# Patient Record
Sex: Male | Born: 1980 | Race: White | Hispanic: No | Marital: Married | Smoking: Never smoker
Health system: Southern US, Community
[De-identification: ages and names within clinical notes are randomized; demographics above are authoritative.]

## PROBLEM LIST (undated history)

## (undated) DIAGNOSIS — G43909 Migraine, unspecified, not intractable, without status migrainosus: Secondary | ICD-10-CM

---

## 2009-08-22 ENCOUNTER — Ambulatory Visit: Payer: Self-pay | Admitting: Family Medicine

## 2009-08-23 LAB — CONVERTED CEMR LAB
AST: 20 units/L (ref 0–37)
Albumin: 4.8 g/dL (ref 3.5–5.2)
BUN: 19 mg/dL (ref 6–23)
Basophils Absolute: 0 10*3/uL (ref 0.0–0.1)
CO2: 30 meq/L (ref 19–32)
Chloride: 101 meq/L (ref 96–112)
Cholesterol: 186 mg/dL (ref 0–200)
Eosinophils Absolute: 0.1 10*3/uL (ref 0.0–0.7)
GFR calc non Af Amer: 104.51 mL/min (ref >60)
Glucose, Bld: 94 mg/dL (ref 70–99)
HCT: 44.1 % (ref 39.0–52.0)
Hemoglobin: 15.1 g/dL (ref 13.0–17.0)
Lymphs Abs: 2.3 10*3/uL (ref 0.7–4.0)
MCHC: 34.3 g/dL (ref 30.0–36.0)
MCV: 91.3 fL (ref 78.0–100.0)
Monocytes Absolute: 0.4 10*3/uL (ref 0.1–1.0)
Neutro Abs: 3.3 10*3/uL (ref 1.4–7.7)
Platelets: 290 10*3/uL (ref 150.0–400.0)
Potassium: 5.1 meq/L (ref 3.5–5.1)
RDW: 12.9 % (ref 11.5–14.6)
Sodium: 138 meq/L (ref 135–145)
TSH: 1.82 microintl units/mL (ref 0.35–5.50)
Total Bilirubin: 1.7 mg/dL — ABNORMAL HIGH (ref 0.3–1.2)
VLDL: 12.4 mg/dL (ref 0.0–40.0)

## 2010-03-05 NOTE — Assessment & Plan Note (Signed)
Summary: new to est---requesting cpx//ccm   Vital Signs:  Patient profile:   30 year old male Height:      71.50 inches Weight:      197 pounds BMI:     27.19 Temp:     98.8 degrees F oral Pulse rate:   80 / minute Pulse rhythm:   regular Resp:     12 per minute BP sitting:   110 / 80  (left arm) Cuff size:   regular  Vitals Entered By: Sid Falcon LPN (August 22, 2009 11:36 AM) CC: New to eatablish   History of Present Illness: New patient to establish care and for complete physical examination.  Past medical history reviewed. No chronic problems. Remote history of concussion 1998 but none since then. Takes no medications. No known drug allergies.  Family history unremarkable.  Social history - he is married. No children. Nonsmoker. Occasional alcohol use. 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 Alcohol use-yes Regular exercise-yes Occupation:  employed Does Patient Exercise:  yes Smoking Status:  never  Review of Systems  The patient denies anorexia, fever, weight loss, weight gain, vision loss, decreased hearing, hoarseness, chest pain, syncope, dyspnea on exertion, peripheral edema, prolonged cough, headaches, hemoptysis, abdominal pain, melena, hematochezia, severe indigestion/heartburn, hematuria, incontinence, genital sores, muscle weakness, suspicious skin lesions,  transient blindness, difficulty walking, depression, unusual weight change, abnormal bleeding, enlarged lymph nodes, and testicular masses.    Physical Exam  General:  Well-developed,well-nourished,in no acute distress; alert,appropriate and cooperative throughout examination Head:  Normocephalic and atraumatic without obvious abnormalities. No apparent alopecia or balding. Eyes:  No corneal or conjunctival inflammation noted. EOMI. Perrla. Funduscopic exam benign, without hemorrhages, exudates or papilledema. Vision grossly normal. Ears:  External ear exam shows no significant lesions or deformities.  Otoscopic examination reveals clear canals, tympanic membranes are intact bilaterally without bulging, retraction, inflammation or discharge. Hearing is grossly normal bilaterally. Mouth:  Oral mucosa and oropharynx without lesions or exudates.  Teeth in good repair. Neck:  No deformities, masses, or tenderness noted. Lungs:  Normal respiratory effort, chest expands symmetrically. Lungs are clear to auscultation, no crackles or wheezes. Heart:  Normal rate and regular rhythm. S1 and S2 normal without gallop, murmur, click, rub or other extra sounds. Abdomen:  Bowel sounds positive,abdomen soft and non-tender without masses, organomegaly or hernias noted. Genitalia:  Testes bilaterally descended without nodularity, tenderness or masses. No scrotal masses or lesions. No penis lesions or urethral discharge. Msk:  No deformity or scoliosis noted of thoracic or lumbar spine.   Extremities:  No clubbing, cyanosis, edema, or deformity noted with normal full range of motion of all joints.   Neurologic:  alert & oriented X3, cranial nerves II-XII intact, and strength normal in all extremities.   Skin:  small hemangioma left anterior chest wall. He has some scattered nevi which appear benign. No concerning skin lesions Cervical Nodes:  No lymphadenopathy noted Psych:  Cognition and judgment appear intact.  Alert and cooperative with normal attention span and  concentration. No apparent delusions, illusions, hallucinations   Impression & Recommendations:  Problem # 1:  Preventive Health Care (ICD-V70.0) obtain screening lab work. Needs tetanus booster by next year. Continue regular exercise.  Other Orders: TLB-Lipid Panel (80061-LIPID) TLB-BMP (Basic Metabolic Panel-BMET) (80048-METABOL) TLB-CBC Platelet - w/Differential (85025-CBCD) TLB-Hepatic/Liver Function Pnl (80076-HEPATIC) TLB-TSH (Thyroid Stimulating Hormone) (84443-TSH) Venipuncture (16109) Specimen Handling (60454)  Patient Instructions: 1)  It is important that you exercise reguarly at least 20 minutes 5 times a week. If you develop chest pain, have severe difficulty breathing, or feel very tired, stop exercising immediately and seek medical attention.  2)  You have an angioma (benign lesion) L chest wall.    Preventive Care Screening  Last Tetanus Booster:    Date:  02/04/2000    Results:  Historical

## 2011-08-21 ENCOUNTER — Ambulatory Visit (INDEPENDENT_AMBULATORY_CARE_PROVIDER_SITE_OTHER): Payer: BC Managed Care – PPO | Admitting: Family Medicine

## 2011-08-21 ENCOUNTER — Encounter: Payer: Self-pay | Admitting: Family Medicine

## 2011-08-21 VITALS — BP 110/82 | Temp 98.3°F | Wt 189.0 lb

## 2011-08-21 DIAGNOSIS — L259 Unspecified contact dermatitis, unspecified cause: Secondary | ICD-10-CM

## 2011-08-21 NOTE — Patient Instructions (Addendum)

## 2011-08-21 NOTE — Progress Notes (Signed)
  Subjective:    Patient ID: Matthew Henry, male    DOB: 1981-01-28, 31 y.o.   MRN: [id number]  HPI  Acute visit. Pruritic rash involving extremities upper and lower. Onset last weekend. [facility] Given oral prednisone taper but only for 6 days. Rash has not improved much. He also tried triamcinolone steroid cream without improvement. Has significant itching. Getting ready to travel out of country. No alleviating factors  Review of Systems  Constitutional: Negative for fever and chills.  Skin: Positive for rash.       Objective:   Physical Exam  Constitutional: He appears well-developed and well-nourished. No distress.  Cardiovascular: Normal rate and regular rhythm.   Skin:       Patient has slightly raised vesicular rash in linear distribution upper extremities and minimal involvement below the left eye          Assessment & Plan:  Contact dermatitis. Depo-Medrol 120 mg IM given. Finish out oral taper

## 2011-10-20 ENCOUNTER — Ambulatory Visit (INDEPENDENT_AMBULATORY_CARE_PROVIDER_SITE_OTHER): Payer: BC Managed Care – PPO | Admitting: Family Medicine

## 2011-10-20 ENCOUNTER — Encounter: Payer: Self-pay | Admitting: Family Medicine

## 2011-10-20 VITALS — BP 120/80 | Temp 98.2°F | Wt 182.0 lb

## 2011-10-20 DIAGNOSIS — L299 Pruritus, unspecified: Secondary | ICD-10-CM

## 2011-10-20 MED ORDER — PREDNISONE 10 MG PO TABS: ORAL_TABLET | ORAL | Status: DC

## 2011-10-20 NOTE — Progress Notes (Signed)
  Subjective:    Patient ID: Matthew Henry, male    DOB: 10/28/1980, 31 y.o.   MRN: [id number]  HPI  Pruritus. Patient had recent contact dermatitis back in July. Possible exposure couple days ago. No rash yet but he is concerned because traveling to [place] this Wednesday. He noticed some itching on his hands and forearms. He eventually responded to prednisone last visit. Itching exacerbated by heat. No nausea or vomiting.   Review of Systems  Constitutional: Negative for fever and chills.  Skin: Negative for rash.       Objective:   Physical Exam  Constitutional: He appears well-developed and well-nourished.  Cardiovascular: Normal rate and regular rhythm.   Pulmonary/Chest: Effort normal and breath sounds normal. No respiratory distress. He has no wheezes. He has no rales.  Skin: No rash noted.          Assessment & Plan:  Pruritus. Patient concerned about possible impending issues with contact dermatitis. We've not recommended medications at this time. We did agree to writing prescription for prednisone taper to take with him in case he develops rash of the next day

## 2012-10-25 ENCOUNTER — Other Ambulatory Visit (INDEPENDENT_AMBULATORY_CARE_PROVIDER_SITE_OTHER): Payer: BC Managed Care – PPO

## 2012-10-25 DIAGNOSIS — Z0000 Encounter for general adult medical examination without abnormal findings: Secondary | ICD-10-CM

## 2012-10-25 LAB — CBC WITH DIFFERENTIAL/PLATELET
Basophils Relative: 0.8 % (ref 0.0–3.0)
Eosinophils Relative: 2.3 % (ref 0.0–5.0)
Hemoglobin: 13.7 g/dL (ref 13.0–17.0)
Lymphocytes Relative: 42.4 % (ref 12.0–46.0)
MCHC: 34.1 g/dL (ref 30.0–36.0)
MCV: 89.6 fl (ref 78.0–100.0)
Neutro Abs: 2.1 10*3/uL (ref 1.4–7.7)
Neutrophils Relative %: 47.1 % (ref 43.0–77.0)
RBC: 4.48 Mil/uL (ref 4.22–5.81)
WBC: 4.5 10*3/uL (ref 4.5–10.5)

## 2012-10-25 LAB — BASIC METABOLIC PANEL
CO2: 26 mEq/L (ref 19–32)
Chloride: 106 mEq/L (ref 96–112)
Creatinine, Ser: 0.9 mg/dL (ref 0.4–1.5)
Sodium: 138 mEq/L (ref 135–145)

## 2012-10-25 LAB — POCT URINALYSIS DIPSTICK
Blood, UA: NEGATIVE
Ketones, UA: NEGATIVE
Protein, UA: NEGATIVE
Spec Grav, UA: 1.015
Urobilinogen, UA: 0.2
pH, UA: 7.5

## 2012-10-25 LAB — HEPATIC FUNCTION PANEL
ALT: 28 U/L (ref 0–53)
Albumin: 4.2 g/dL (ref 3.5–5.2)
Alkaline Phosphatase: 33 U/L — ABNORMAL LOW (ref 39–117)
Bilirubin, Direct: 0.3 mg/dL (ref 0.0–0.3)
Total Protein: 6.7 g/dL (ref 6.0–8.3)

## 2012-10-25 LAB — LIPID PANEL
LDL Cholesterol: 111 mg/dL — ABNORMAL HIGH (ref 0–99)
Total CHOL/HDL Ratio: 3
VLDL: 4.6 mg/dL (ref 0.0–40.0)

## 2012-11-04 ENCOUNTER — Encounter: Payer: BC Managed Care – PPO | Admitting: Family Medicine

## 2012-11-24 ENCOUNTER — Encounter: Payer: BC Managed Care – PPO | Admitting: Family Medicine

## 2012-12-22 ENCOUNTER — Encounter: Payer: Self-pay | Admitting: Family Medicine

## 2012-12-22 ENCOUNTER — Ambulatory Visit (INDEPENDENT_AMBULATORY_CARE_PROVIDER_SITE_OTHER): Payer: BC Managed Care – PPO | Admitting: Family Medicine

## 2012-12-22 VITALS — BP 120/68 | HR 65 | Temp 98.0°F | Ht 71.0 in | Wt 178.0 lb

## 2012-12-22 DIAGNOSIS — Z0000 Encounter for general adult medical examination without abnormal findings: Secondary | ICD-10-CM

## 2012-12-22 NOTE — Progress Notes (Signed)
  Subjective:    Patient ID: Matthew Henry, male    DOB: Jun 24, 1980, 32 y.o.   MRN: [id number]  HPI Patient is seen for complete physical No chronic medical problems. Generally very healthy. He runs for exercise most days of week No history of smoking. He has not had tetanus in over 10 years and he has not sure he wishes to pursue this today. He expresses concern because of some additives that might conflict with his religious beliefs. He also declines flu vaccine.  No past medical history on file. No past surgical history on file.  reports that he has never smoked. He does not have any smokeless tobacco history on file. His alcohol and drug histories are not on file. family history is not on file. No Known Allergies   Review of Systems  Constitutional: Negative for fever, activity change, appetite change and fatigue.  HENT: Negative for congestion, ear pain and trouble swallowing.   Eyes: Negative for pain and visual disturbance.  Respiratory: Negative for cough, shortness of breath and wheezing.   Cardiovascular: Negative for chest pain and palpitations.  Gastrointestinal: Negative for nausea, vomiting, abdominal pain, diarrhea, constipation, blood in stool, abdominal distention and rectal pain.  Genitourinary: Negative for dysuria, hematuria and testicular pain.  Musculoskeletal: Negative for arthralgias and joint swelling.  Skin: Negative for rash.  Neurological: Negative for dizziness, syncope and headaches.  Hematological: Negative for adenopathy.  Psychiatric/Behavioral: Negative for confusion and dysphoric mood.       Objective:   Physical Exam  Constitutional: He is oriented to person, place, and time. He appears well-developed and well-nourished. No distress.  HENT:  Head: Normocephalic and atraumatic.  Right Ear: External ear normal.  Left Ear: External ear normal.  Mouth/Throat: Oropharynx is clear and moist.  Eyes: Conjunctivae and EOM are normal. Pupils are  equal, round, and reactive to light.  Neck: Normal range of motion. Neck supple. No thyromegaly present.  Cardiovascular: Normal rate, regular rhythm and normal heart sounds.   No murmur heard. Pulmonary/Chest: No respiratory distress. He has no wheezes. He has no rales.  Abdominal: Soft. Bowel sounds are normal. He exhibits no distension and no mass. There is no tenderness. There is no rebound and no guarding.  Musculoskeletal: He exhibits no edema.  Lymphadenopathy:    He has no cervical adenopathy.  Neurological: He is alert and oriented to person, place, and time. He displays normal reflexes. No cranial nerve deficit.  Skin: No rash noted.  Psychiatric: He has a normal mood and affect.          Assessment & Plan:  Complete physical. Healthy 32 year old male. No major lab concerns. Tetanus offered but declined by patient. We also offered flu vaccine he declines

## 2012-12-22 NOTE — Progress Notes (Signed)
Pre visit review using our clinic review tool, if applicable. No additional management support is needed unless otherwise documented below in the visit note. 

## 2012-12-22 NOTE — Patient Instructions (Signed)
Let us know if you are interested in getting Tdap.

## 2013-01-17 ENCOUNTER — Ambulatory Visit: Payer: BC Managed Care – PPO

## 2014-11-02 ENCOUNTER — Ambulatory Visit (INDEPENDENT_AMBULATORY_CARE_PROVIDER_SITE_OTHER): Payer: BLUE CROSS/BLUE SHIELD | Admitting: Family Medicine

## 2014-11-02 ENCOUNTER — Encounter: Payer: Self-pay | Admitting: Family Medicine

## 2014-11-02 VITALS — BP 122/70 | HR 99 | Temp 97.6°F | Ht 71.0 in | Wt 178.0 lb

## 2014-11-02 DIAGNOSIS — M546 Pain in thoracic spine: Secondary | ICD-10-CM | POA: Diagnosis not present

## 2014-11-02 LAB — BASIC METABOLIC PANEL
BUN: 16 mg/dL (ref 6–23)
CHLORIDE: 105 meq/L (ref 96–112)
CO2: 32 meq/L (ref 19–32)
CREATININE: 1.14 mg/dL (ref 0.40–1.50)
Calcium: 9.4 mg/dL (ref 8.4–10.5)
GFR: 78.78 mL/min (ref >60.00)
Glucose, Bld: 74 mg/dL (ref 70–99)
POTASSIUM: 4.3 meq/L (ref 3.5–5.1)
SODIUM: 141 meq/L (ref 135–145)

## 2014-11-02 LAB — POCT URINALYSIS DIPSTICK
Bilirubin, UA: NEGATIVE
Blood, UA: NEGATIVE
GLUCOSE UA: NEGATIVE
Ketones, UA: NEGATIVE
LEUKOCYTES UA: NEGATIVE
NITRITE UA: NEGATIVE
PROTEIN UA: NEGATIVE
SPEC GRAV UA: 1.015
UROBILINOGEN UA: 0.2
pH, UA: 7.5

## 2014-11-02 LAB — CBC WITH DIFFERENTIAL/PLATELET
BASOS ABS: 0 10*3/uL (ref 0.0–0.1)
Basophils Relative: 0.6 % (ref 0.0–3.0)
EOS ABS: 0.1 10*3/uL (ref 0.0–0.7)
Eosinophils Relative: 1.9 % (ref 0.0–5.0)
HCT: 43 % (ref 39.0–52.0)
Hemoglobin: 14.4 g/dL (ref 13.0–17.0)
LYMPHS ABS: 1.9 10*3/uL (ref 0.7–4.0)
LYMPHS PCT: 36.9 % (ref 12.0–46.0)
MCHC: 33.4 g/dL (ref 30.0–36.0)
MCV: 89.8 fl (ref 78.0–100.0)
MONOS PCT: 6 % (ref 3.0–12.0)
Monocytes Absolute: 0.3 10*3/uL (ref 0.1–1.0)
NEUTROS ABS: 2.9 10*3/uL (ref 1.4–7.7)
NEUTROS PCT: 54.6 % (ref 43.0–77.0)
PLATELETS: 268 10*3/uL (ref 150.0–400.0)
RBC: 4.79 Mil/uL (ref 4.22–5.81)
RDW: 13.1 % (ref 11.5–15.5)
WBC: 5.2 10*3/uL (ref 4.0–10.5)

## 2014-11-02 LAB — HEPATIC FUNCTION PANEL
ALK PHOS: 35 U/L — AB (ref 39–117)
ALT: 14 U/L (ref 0–53)
AST: 14 U/L (ref 0–37)
Albumin: 4.3 g/dL (ref 3.5–5.2)
BILIRUBIN DIRECT: 0.3 mg/dL (ref 0.0–0.3)
Total Bilirubin: 1.5 mg/dL — ABNORMAL HIGH (ref 0.2–1.2)
Total Protein: 7 g/dL (ref 6.0–8.3)

## 2014-11-02 LAB — SEDIMENTATION RATE: SED RATE: 7 mm/h (ref 0–22)

## 2014-11-02 NOTE — Patient Instructions (Signed)
Follow up for any fever, appetite or weight changes, progressive pain, or any other new symptoms.

## 2014-11-02 NOTE — Progress Notes (Signed)
   Subjective:    Patient ID: Matthew Henry, male    DOB: 05/14/1980, 34 y.o.   MRN: [id number]  HPI  Patient seen with new problem of 2-1/2 month history of mid thoracic back pain. He denies any injury. He does some regular weightlifting and running and has had no difficulty with his dose. His job does require frequent standing. He describes a dull achy pain which tends to be worse at night. He denies any associated symptoms such as abdominal pain, fever, chills, night sweats, cough, dyspnea, or pleuritic pain. He does have a couple young children but denies any injuries with lifting them. Pain radiates helped somewhat bilateral. He has not noticed any actual spine tenderness. Symptoms are not exacerbated by any particular movement other than occasionally extending the back. No chest pains  No past medical history on file. No past surgical history on file.  reports that he has never smoked. He does not have any smokeless tobacco history on file. His alcohol and drug histories are not on file. family history is not on file. No Known Allergies    Review of Systems  Constitutional: Negative for fever, chills, appetite change and unexpected weight change.  Respiratory: Negative for cough and shortness of breath.   Cardiovascular: Negative for chest pain.  Gastrointestinal: Negative for abdominal pain.  Genitourinary: Negative for dysuria and hematuria.  Musculoskeletal: Positive for back pain.  Skin: Negative for rash.  Hematological: Negative for adenopathy.       Objective:   Physical Exam  Constitutional: He appears well-developed and well-nourished.  Cardiovascular: Normal rate and regular rhythm.   Pulmonary/Chest: Effort normal and breath sounds normal. No respiratory distress. He has no wheezes. He has no rales.  Abdominal: Soft. Bowel sounds are normal. He exhibits no distension and no mass. There is no tenderness. There is no rebound and no guarding.  Musculoskeletal:  Patient  has no reproducible spine tenderness. Full range of motion cervical spine. He has some mild muscular tenderness bilateral thoracic area about midway down each side  Skin: No rash noted.          Assessment & Plan:  New problem of thoracic back pain which is bilateral. He does not have any red flags such as appetite or weight changes, fever, etc. Suspect muscular. Given duration, will obtain further labs with CBC, chemistries, sedimentation rate, urinalysis. We discussed possible plain films. We also discussed physical therapy but he will be out of town for the next few weeks.   in the meantime, consider over-the-counter Advil or Aleve as needed. Consider muscle massage and heat for symptom relief

## 2014-11-02 NOTE — Progress Notes (Signed)
Pre visit review using our clinic review tool, if applicable. No additional management support is needed unless otherwise documented below in the visit note. 

## 2015-06-05 ENCOUNTER — Encounter: Payer: Self-pay | Admitting: Adult Health

## 2015-06-05 ENCOUNTER — Ambulatory Visit (INDEPENDENT_AMBULATORY_CARE_PROVIDER_SITE_OTHER): Payer: BLUE CROSS/BLUE SHIELD | Admitting: Adult Health

## 2015-06-05 VITALS — BP 108/70 | Temp 98.1°F | Ht 71.0 in | Wt 178.2 lb

## 2015-06-05 DIAGNOSIS — H6593 Unspecified nonsuppurative otitis media, bilateral: Secondary | ICD-10-CM | POA: Diagnosis not present

## 2015-06-05 DIAGNOSIS — J0140 Acute pansinusitis, unspecified: Secondary | ICD-10-CM

## 2015-06-05 NOTE — Patient Instructions (Addendum)
Start taking the antibiotics as prescribed  You can use Afrin for 2 days and Flonase every day. This will help get rid of the fluid behind the ears.   Follow up with myself or Dr. Jacinto Reap on Friday if there is no improvement.     Otitis Media With Effusion Otitis media with effusion is the presence of fluid in the middle ear. This is a common problem in children, which often follows ear infections. It may be present for weeks or longer after the infection. Unlike an acute ear infection, otitis media with effusion refers only to fluid behind the ear drum and not infection. Children with repeated ear and sinus infections and allergy problems are the most likely to get otitis media with effusion. CAUSES  The most frequent cause of the fluid buildup is dysfunction of the eustachian tubes. These are the tubes that drain fluid in the ears to the back of the nose (nasopharynx). SYMPTOMS   The main symptom of this condition is hearing loss. As a result, you or your child may:  Listen to the TV at a loud volume.  Not respond to questions.  Ask "what" often when spoken to.  Mistake or confuse one sound or word for another.  There may be a sensation of fullness or pressure but usually not pain. DIAGNOSIS   Your health care provider will diagnose this condition by examining you or your child's ears.  Your health care provider may test the pressure in you or your child's ear with a tympanometer.  A hearing test may be conducted if the problem persists. TREATMENT   Treatment depends on the duration and the effects of the effusion.  Antibiotics, decongestants, nose drops, and cortisone-type drugs (tablets or nasal spray) may not be helpful.  Children with persistent ear effusions may have delayed language or behavioral problems. Children at risk for developmental delays in hearing, learning, and speech may require referral to a specialist earlier than children not at risk.  You or your child's  health care provider may suggest a referral to an ear, nose, and throat surgeon for treatment. The following may help restore normal hearing:  Drainage of fluid.  Placement of ear tubes (tympanostomy tubes).  Removal of adenoids (adenoidectomy). HOME CARE INSTRUCTIONS   Avoid secondhand smoke.  Infants who are breastfed are less likely to have this condition.  Avoid feeding infants while they are lying flat.  Avoid known environmental allergens.  Avoid people who are sick. SEEK MEDICAL CARE IF:   Hearing is not better in 3 months.  Hearing is worse.  Ear pain.  Drainage from the ear.  Dizziness. MAKE SURE YOU:   Understand these instructions.  Will watch your condition.  Will get help right away if you are not doing well or get worse.   This information is not intended to replace advice given to you by your health care provider. Make sure you discuss any questions you have with your health care provider.   Document Released: 02/28/2004 Document Revised: 02/10/2014 Document Reviewed: 08/17/2012 Elsevier Interactive Patient Education [employer].

## 2015-06-05 NOTE — Progress Notes (Signed)
   Subjective:    Patient ID: Matthew Henry, male    DOB: [birth year], 35 y.o.   MRN: [id number]  HPI  35 year old male who presents to the office today for sinus pain and pressure, the feeling of ear fullness ( R>L). He is starting to have migraines and he endorses slight photophobia.   He was seen at Urgent Care three days ago and he was prescribed Augmentin and Sudafed 12 hour. He reports that it does not seem to be working. He started taking his first dose mid day yesterday due to having a stomach virus the day prior. He has not taken any antibiotics today.   He denies any discharge from the ears.    Review of Systems  HENT: Positive for ear pain. Negative for ear discharge.   All other systems reviewed and are negative.  No past medical history on file.  Social History   Social History  . Marital Status: Married    Spouse Name: N/A  . Number of Children: N/A  . Years of Education: N/A   Occupational History  . Not on file.   Social History Main Topics  . Smoking status: Never Smoker   . Smokeless tobacco: Not on file  . Alcohol Use: Not on file  . Drug Use: Not on file  . Sexual Activity: Not on file   Other Topics Concern  . Not on file   Social History Narrative    No past surgical history on file.  No family history on file.  No Known Allergies  No current outpatient prescriptions on file prior to visit.   No current facility-administered medications on file prior to visit.    BP 108/70 mmHg  Temp(Src) 98.1 F (36.7 C) (Oral)  Ht 5\' 11"  (1.803 m)  Wt 178 lb 3.2 oz (80.831 kg)  BMI 24.86 kg/m2       Objective:   Physical Exam  Constitutional: He appears well-developed and well-nourished. No distress.  HENT:  Head: Normocephalic and atraumatic.  Right Ear: External ear and ear canal normal. Tympanic membrane is bulging. Tympanic membrane is not erythematous.  Left Ear: Hearing, external ear and ear canal normal. Tympanic membrane is bulging.  Tympanic membrane is not retracted.  Nose: Nose normal. No mucosal edema or rhinorrhea. Right sinus exhibits no maxillary sinus tenderness and no frontal sinus tenderness. Left sinus exhibits no maxillary sinus tenderness and no frontal sinus tenderness.  Mouth/Throat: Uvula is midline and oropharynx is clear and moist. No oropharyngeal exudate, posterior oropharyngeal edema, posterior oropharyngeal erythema or tonsillar abscesses.  Skin: He is not diaphoretic.  Nursing note and vitals reviewed.     Assessment & Plan:  1. Otitis media with effusion, bilateral -  Take antibiotics as directed.  - Add Flonase - Can use Afrin for 2-3 days - Sudafed - Follow up if no improvement in the next 2-3 days 2. Acute pansinusitis, recurrence not specified - Continue with Augmentin  - Flonase - Afrin

## 2015-06-15 ENCOUNTER — Encounter: Payer: Self-pay | Admitting: Family Medicine

## 2015-06-15 ENCOUNTER — Ambulatory Visit (INDEPENDENT_AMBULATORY_CARE_PROVIDER_SITE_OTHER): Payer: BLUE CROSS/BLUE SHIELD | Admitting: Family Medicine

## 2015-06-15 VITALS — BP 90/70 | HR 91 | Temp 98.6°F | Ht 71.0 in | Wt 180.6 lb

## 2015-06-15 DIAGNOSIS — H938X3 Other specified disorders of ear, bilateral: Secondary | ICD-10-CM | POA: Diagnosis not present

## 2015-06-15 DIAGNOSIS — R5383 Other fatigue: Secondary | ICD-10-CM | POA: Diagnosis not present

## 2015-06-15 DIAGNOSIS — G44209 Tension-type headache, unspecified, not intractable: Secondary | ICD-10-CM

## 2015-06-15 MED ORDER — PREDNISONE 10 MG PO TABS: ORAL_TABLET | ORAL | Status: DC

## 2015-06-15 MED ORDER — AMITRIPTYLINE HCL 10 MG PO TABS: 10.0000 mg | ORAL_TABLET | Freq: Every day | ORAL | Status: DC

## 2015-06-15 NOTE — Progress Notes (Signed)
Pre visit review using our clinic review tool, if applicable. No additional management support is needed unless otherwise documented below in the visit note. 

## 2015-06-15 NOTE — Patient Instructions (Signed)
Follow up for any fever or worsening headache.

## 2015-06-15 NOTE — Progress Notes (Signed)
   Subjective:    Patient ID: Matthew Henry, male    DOB: 01-Oct-1980, 35 y.o.   MRN: [id number]  HPI Patient seen for follow up regarding headaches and persistent bilateral ear congestion. He developed some bilateral ear pain and went to urgent care a few weeks ago. Was prescribed Augmentin. Was also taking some Sudafed. Was seen back here in follow-up with not much improvement in symptoms. It was noted on visit here that he had "bulging eardrums "bilaterally. Patient finished out Augmentin and also added Flonase for nasal congestion.  At this point, he has some clear nasal mucus from the right nostril but not the left. No nosebleed. He is describing headaches which are bifrontal and also bilateral occipital with some associated muscle soreness and neck soreness. Not sleeping well. No history of chronic headache. Nonspecific malaise. Headaches have become daily. They're very symmetric. No photophobia. Mild to moderate severity No exertional headache. No vomiting. Sometimes feel slightly lightheaded. No vertigo. Still has bilateral ear congestion symptoms but no hearing changes.  No past medical history on file. No past surgical history on file.  reports that he has never smoked. He does not have any smokeless tobacco history on file. His alcohol and drug histories are not on file. family history is not on file. No Known Allergies     Review of Systems  Constitutional: Positive for fatigue. Negative for chills.  HENT: Positive for congestion. Negative for ear discharge, ear pain and hearing loss.   Respiratory: Negative for cough and shortness of breath.   Cardiovascular: Negative for chest pain.  Neurological: Positive for light-headedness and headaches. Negative for tremors, seizures, syncope and weakness.       Objective:   Physical Exam  Constitutional: He is oriented to person, place, and time. He appears well-developed and well-nourished.  HENT:  Right Ear: External ear  normal.  Left Ear: External ear normal.  Neck: Neck supple. No thyromegaly present.  Cardiovascular: Normal rate and regular rhythm.   Pulmonary/Chest: Effort normal and breath sounds normal. No respiratory distress. He has no wheezes. He has no rales.  Musculoskeletal: He exhibits no edema.  Lymphadenopathy:    He has no cervical adenopathy.  Neurological: He is alert and oriented to person, place, and time. He has normal reflexes. No cranial nerve deficit. Coordination normal.          Assessment & Plan:  Patient presents with multiple symptoms including fatigue, daily bilateral headache, persistent bilateral ear congestion. Exam today's does not show any evidence for persistent chronic otitis media. No obvious evidence for effusion. He has some persistent sinus congestion and may have some eustachian tube dysfunction. 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 We recommend the following:  -Short-term trial of amitriptyline 10 mg daily at bedtime in attempt to break his headache pattern. Avoid daily use of analgesics. -Short-term use of prednisone for his persistent congestive symptom -No indication for further antibiotics at this time -Reassess in 2 weeks and sooner as needed for any worsening headache or new symptoms  Eulas Post MD [facility]

## 2015-06-23 ENCOUNTER — Inpatient Hospital Stay (HOSPITAL_COMMUNITY)
Admission: EM | Admit: 2015-06-23 | Discharge: 2015-06-23 | DRG: 055 | Disposition: A | Discharge status: Other Acute Inpatient Hospital | Payer: BLUE CROSS/BLUE SHIELD | Attending: Neurosurgery | Admitting: Neurosurgery

## 2015-06-23 ENCOUNTER — Encounter (HOSPITAL_COMMUNITY): Payer: Self-pay | Admitting: Oncology

## 2015-06-23 ENCOUNTER — Inpatient Hospital Stay (HOSPITAL_COMMUNITY): Payer: BLUE CROSS/BLUE SHIELD

## 2015-06-23 ENCOUNTER — Emergency Department (HOSPITAL_COMMUNITY): Payer: BLUE CROSS/BLUE SHIELD

## 2015-06-23 DIAGNOSIS — W1830XA Fall on same level, unspecified, initial encounter: Secondary | ICD-10-CM | POA: Diagnosis not present

## 2015-06-23 DIAGNOSIS — Z79899 Other long term (current) drug therapy: Secondary | ICD-10-CM

## 2015-06-23 DIAGNOSIS — D496 Neoplasm of unspecified behavior of brain: Secondary | ICD-10-CM | POA: Diagnosis not present

## 2015-06-23 DIAGNOSIS — G9389 Other specified disorders of brain: Secondary | ICD-10-CM | POA: Diagnosis present

## 2015-06-23 DIAGNOSIS — G939 Disorder of brain, unspecified: Secondary | ICD-10-CM | POA: Diagnosis present

## 2015-06-23 HISTORY — DX: Migraine, unspecified, not intractable, without status migrainosus: G43.909

## 2015-06-23 LAB — MRSA PCR SCREENING: MRSA BY PCR: NEGATIVE

## 2015-06-23 LAB — CBC WITH DIFFERENTIAL/PLATELET
BASOS ABS: 0 10*3/uL (ref 0.0–0.1)
Basophils Relative: 0 %
EOS ABS: 0 10*3/uL (ref 0.0–0.7)
Eosinophils Relative: 0 %
HCT: 41.3 % (ref 39.0–52.0)
HEMOGLOBIN: 14 g/dL (ref 13.0–17.0)
LYMPHS ABS: 0.9 10*3/uL (ref 0.7–4.0)
LYMPHS PCT: 11 %
MCH: 30 pg (ref 26.0–34.0)
MCHC: 33.9 g/dL (ref 30.0–36.0)
MCV: 88.4 fL (ref 78.0–100.0)
Monocytes Absolute: 0.4 10*3/uL (ref 0.1–1.0)
Monocytes Relative: 5 %
NEUTROS PCT: 84 %
Neutro Abs: 6.8 10*3/uL (ref 1.7–7.7)
PLATELETS: 242 10*3/uL (ref 150–400)
RBC: 4.67 MIL/uL (ref 4.22–5.81)
RDW: 12.8 % (ref 11.5–15.5)
WBC: 8.1 10*3/uL (ref 4.0–10.5)

## 2015-06-23 LAB — BASIC METABOLIC PANEL
ANION GAP: 5 (ref 5–15)
BUN: 14 mg/dL (ref 6–20)
CALCIUM: 9 mg/dL (ref 8.9–10.3)
CO2: 27 mmol/L (ref 22–32)
Chloride: 104 mmol/L (ref 101–111)
Creatinine, Ser: 0.93 mg/dL (ref 0.61–1.24)
GLUCOSE: 152 mg/dL — AB (ref 65–99)
Potassium: 4.5 mmol/L (ref 3.5–5.1)
SODIUM: 136 mmol/L (ref 135–145)

## 2015-06-23 MED ORDER — NALOXONE HCL 0.4 MG/ML IJ SOLN
INTRAMUSCULAR | Status: AC
  Administered 2015-06-23: 0.2 mg via INTRAVENOUS
  Filled 2015-06-23: qty 1

## 2015-06-23 MED ORDER — MORPHINE SULFATE (PF) 2 MG/ML IV SOLN
1.0000 mg | INTRAVENOUS | Status: DC | PRN
  Administered 2015-06-23: 2 mg via INTRAVENOUS
  Filled 2015-06-23: qty 1

## 2015-06-23 MED ORDER — DEXAMETHASONE SODIUM PHOSPHATE 4 MG/ML IJ SOLN
8.0000 mg | INTRAMUSCULAR | Status: DC
  Administered 2015-06-23: 8 mg via INTRAVENOUS
  Filled 2015-06-23: qty 2

## 2015-06-23 MED ORDER — MORPHINE SULFATE (PF) 2 MG/ML IV SOLN
1.0000 mg | INTRAVENOUS | Status: DC | PRN
  Administered 2015-06-23: 1 mg via INTRAVENOUS
  Filled 2015-06-23: qty 1

## 2015-06-23 MED ORDER — GADOBENATE DIMEGLUMINE 529 MG/ML IV SOLN
15.0000 mL | Freq: Once | INTRAVENOUS | Status: AC | PRN
  Administered 2015-06-23: 15 mL via INTRAVENOUS

## 2015-06-23 MED ORDER — VALPROATE SODIUM 500 MG/5ML IV SOLN
1000.0000 mg | Freq: Once | INTRAVENOUS | Status: AC
  Administered 2015-06-23: 1000 mg via INTRAVENOUS
  Filled 2015-06-23: qty 10

## 2015-06-23 MED ORDER — PROCHLORPERAZINE EDISYLATE 5 MG/ML IJ SOLN
10.0000 mg | Freq: Once | INTRAMUSCULAR | Status: AC
  Administered 2015-06-23: 10 mg via INTRAVENOUS
  Filled 2015-06-23: qty 2

## 2015-06-23 MED ORDER — NALOXONE HCL 0.4 MG/ML IJ SOLN
0.4000 mg | Freq: Once | INTRAMUSCULAR | Status: AC
  Administered 2015-06-23: 0.2 mg via INTRAVENOUS

## 2015-06-23 MED ORDER — SODIUM CHLORIDE 0.9 % IV SOLN
INTRAVENOUS | Status: DC
  Administered 2015-06-23: 75 mL via INTRAVENOUS

## 2015-06-23 MED ORDER — PANTOPRAZOLE SODIUM 40 MG IV SOLR
40.0000 mg | INTRAVENOUS | Status: DC
  Filled 2015-06-23: qty 40

## 2015-06-23 MED ORDER — DEXAMETHASONE SODIUM PHOSPHATE 10 MG/ML IJ SOLN
10.0000 mg | Freq: Once | INTRAMUSCULAR | Status: AC
  Administered 2015-06-23: 10 mg via INTRAVENOUS
  Filled 2015-06-23: qty 1

## 2015-06-23 MED ORDER — SODIUM CHLORIDE 0.9 % IV BOLUS (SEPSIS)
1000.0000 mL | Freq: Once | INTRAVENOUS | Status: AC
  Administered 2015-06-23: 1000 mL via INTRAVENOUS

## 2015-06-23 MED ORDER — DIPHENHYDRAMINE HCL 50 MG/ML IJ SOLN
25.0000 mg | Freq: Once | INTRAMUSCULAR | Status: AC
  Administered 2015-06-23: 25 mg via INTRAVENOUS
  Filled 2015-06-23: qty 1

## 2015-06-23 NOTE — Discharge Summary (Signed)
Physician Discharge Summary  Patient ID: Matthew Henry MRN: [id number] DOB/AGE: 1980-07-22 35 y.o.  Admit date: 06/23/2015 Discharge date: 06/23/2015  Admission Diagnoses:brain tumor  Discharge Diagnoses:  Active Problems:   Brain mass   Discharged Condition: stable  Hospital Course: observation  Consults none  Significant Diagnostic Studies: mri  Treatments: decadron, protonix,depakote  Discharge Exam: Blood pressure 129/59, pulse 61, temperature 98.5 F (36.9 C), temperature source Oral, resp. rate 18, height 6' (1.829 m), weight 81.647 kg (180 lb), SpO2 97 %. Except for photophobia he is neuro he is intact  Disposition:wife wants him to be transferred to [facility] . Dr Maricela Bo agrees to accept him in transfer     Medication List    ASK your doctor about these medications        amitriptyline 10 MG tablet  Commonly known as:  ELAVIL  Take 1 tablet (10 mg total) by mouth at bedtime.     fluticasone 50 MCG/ACT nasal spray  Commonly known as:  FLONASE  Place 1 spray into both nostrils daily.     multivitamin with minerals Tabs tablet  Take 1 tablet by mouth daily.     naproxen sodium 220 MG tablet  Commonly known as:  ANAPROX  Take 220 mg by mouth 2 (two) times daily as needed (pain).         Signed: Floyce Stakes 06/23/2015, 3:11 PM

## 2015-06-23 NOTE — ED Notes (Signed)
Patient's wife at bedside.

## 2015-06-23 NOTE — ED Notes (Signed)
Cyndee Brightly, pt's wife would like to be called w/ an update.  Her # is [phone].

## 2015-06-23 NOTE — ED Notes (Signed)
Pt transported to CT ?

## 2015-06-23 NOTE — H&P (Signed)
Matthew Henry, ORZEL NO.:  [id number]  MEDICAL RECORD NO.:  [id number]  LOCATION:  3M02C                        FACILITY:  [facility]  PHYSICIAN:  Leeroy Cha, M.D.   DATE OF BIRTH:  [birth year]  DATE OF ADMISSION:  06/23/2015 DATE OF DISCHARGE:                             HISTORY & PHYSICAL   The patient is a gentleman of 35 years old who had been complaining of headache for at least 6 weeks.  He had been treated for sinusitis with antibiotics for ear infection as well as subtle infection. Nevertheless, despite treatment, the headaches getting worse.  This morning, he developed nausea, vomiting, severe headache.  He was quite sensitive to light and sound and he fainted.  He hit his head.  He was brought to the Emergency Room [facility] where a CT scan of the head was done.  We were called, and we decided to transfer the patient immediately to the intensive care unit.  PAST SURGICAL HISTORY:  Negative.  REVIEW OF SYSTEMS:  Positive for headache, nausea, and vomiting.  SOCIAL HISTORY:  He drinks socially.  He does not smoke.  MEDICATIONS:  He is taking Elavil, Flonase, and vitamins.  PHYSICAL EXAMINATION:  GENERAL:  The patient is resting.  He has his head over his eyes to prevent the light sensitivity. VITAL SIGNS:  The blood pressure is 130/70 with a pulse of 72. HEAD, NOSE, AND THROAT:  Normal. NECK:  Normal. LUNGS:  Clear. HEART:  Sounds normal. ABDOMEN:  Normal. EXTREMITIES:  Normal. NEUROLOGICAL:  Completely normal.  He has no weakness.  Sensory is normal.  The cranial nerves are normal.  The CT scan of the head is quite impressive.  This gentleman has severe edema in the right parietal area going to the right frontal and left occipital area with shift from right to left of approximately 10 mm.  It appears that he has had tumor about 8 x 6 cm in the right parietal area.  CLINICAL IMPRESSION:  Mass in the right parietal area with  edema.  RECOMMENDATION:  I talked to him and his wife.  We started him already on Depakote as well as intravenous Decadron.  We are going to obtain an MRI of the brain with and without contrast.  The situation here, the most likely we are dealing with a brain tumor producing quite a bit of edema.  The goal is to treat the edema, most likely taking to surgery early next week  Nevertheless, I am going to give the option to his wife and the patient himself to see if we want to go to one of the medical center, such as [facility] or [facility].  The situation is quite worrisome because this gentleman is absolutely quite normal and quite healthy.          ______________________________ Leeroy Cha, M.D.     EB/MEDQ  D:  06/23/2015  T:  06/23/2015  Job:  [id number]

## 2015-06-23 NOTE — ED Provider Notes (Signed)
CSN: [id number]     Arrival date & time 06/23/15  0515 History   First MD Initiated Contact with Patient 06/23/15 0542     Chief Complaint  Patient presents with  . Headache     (Consider location/radiation/quality/duration/timing/severity/associated sxs/prior Treatment) HPI Comments: Patient presents to the emergency department for evaluation of headache. Patient reports that he has been experiencing intermittent headaches for the last 6 weeks. When it started he had your complaints and sinus congestion, was treated with antibiotics by his primary doctor for ear infection and sinus infection. He reports that his sinus and ear symptoms have not improved and now his headaches are getting worse. Tonight he woke up and had a severe headache associated with nausea vomiting and sensitivity to light and sound. He reports that he vomited once tonight and was streaks of blood and then he passed out. He hit his head on the wall when he passed out.  Patient is a 35 y.o. male presenting with headaches.  Headache   Past Medical History  Diagnosis Date  . Migraines    History reviewed. No pertinent past surgical history. History reviewed. No pertinent family history. Social History  Substance Use Topics  . Smoking status: Never Smoker   . Smokeless tobacco: Never Used  . Alcohol Use: Yes     Comment: rare    Review of Systems  Neurological: Positive for headaches.  All other systems reviewed and are negative.     Allergies  Review of patient's allergies indicates no known allergies.  Home Medications   Prior to Admission medications   Medication Sig Start Date End Date Taking? Authorizing Provider  amitriptyline (ELAVIL) 10 MG tablet Take 1 tablet (10 mg total) by mouth at bedtime. 06/15/15  Yes Eulas Post, MD  fluticasone (FLONASE) 50 MCG/ACT nasal spray Place 1 spray into both nostrils daily.   Yes Historical Provider, MD  Multiple Vitamin (MULTIVITAMIN WITH MINERALS) TABS  tablet Take 1 tablet by mouth daily.   Yes Historical Provider, MD  naproxen sodium (ANAPROX) 220 MG tablet Take 220 mg by mouth 2 (two) times daily as needed (pain).   Yes Historical Provider, MD   BP 123/64 mmHg  Pulse 60  Temp(Src) 98.1 F (36.7 C) (Oral)  Resp 18  Ht 6' (1.829 m)  Wt 180 lb (81.647 kg)  BMI 24.41 kg/m2  SpO2 99% Physical Exam  Constitutional: He is oriented to person, place, and time. He appears well-developed and well-nourished. No distress.  HENT:  Head: Normocephalic and atraumatic.  Right Ear: Hearing normal.  Left Ear: Hearing normal.  Nose: Nose normal.  Mouth/Throat: Oropharynx is clear and moist and mucous membranes are normal.  Eyes: Conjunctivae and EOM are normal. Pupils are equal, round, and reactive to light.  Neck: Normal range of motion. Neck supple.  Cardiovascular: Regular rhythm, S1 normal and S2 normal.  Exam reveals no gallop and no friction rub.   No murmur heard. Pulmonary/Chest: Effort normal and breath sounds normal. No respiratory distress. He exhibits no tenderness.  Abdominal: Soft. Normal appearance and bowel sounds are normal. There is no hepatosplenomegaly. There is no tenderness. There is no rebound, no guarding, no tenderness at McBurney's point and negative Murphy's sign. No hernia.  Musculoskeletal: Normal range of motion.  Neurological: He is alert and oriented to person, place, and time. He has normal strength. No cranial nerve deficit or sensory deficit. Coordination normal. GCS eye subscore is 4. GCS verbal subscore is 5. GCS motor subscore is 6.  Normal grip strength bilaterally Negative pronator drift Sensation bilateral upper extremities Normal lower extremity strength against gravity No ataxia  Skin: Skin is warm, dry and intact. No rash noted. No cyanosis.  Psychiatric: He has a normal mood and affect. His speech is normal and behavior is normal. Thought content normal.  Nursing note and vitals reviewed.   ED Course   Procedures (including critical care time) Labs Review Labs Reviewed  BASIC METABOLIC PANEL - Abnormal; Notable for the following:    Glucose, Bld 152 (*)    All other components within normal limits  CBC WITH DIFFERENTIAL/PLATELET    Imaging Review No results found. I have personally reviewed and evaluated these images and lab results as part of my medical decision-making.   EKG Interpretation None      MDM   Final diagnoses:  Brain mass    Patient presented to the ER for evaluation of headache. Patient has been experiencing progressively worsening headache over period of approximately 6 weeks. Patient had a syncopal episode tonight, falling and hitting his head. CT scan was performed to further evaluate. Results show a large right-sided mass with edema and midline shift. This raises concern for possible seizure causing the syncopal episode.   Case discussed with Dr. Joya Salm, on call for neurosurgery. He recommends administering Decadron and Depakote, transfer to the ICU at [facility]. I did discuss the findings with the patient. I offered to call his wife, but he did not want me to make the call, says that he will call her himself.    Orpah Greek, MD 06/23/15 [phone]

## 2015-06-23 NOTE — Progress Notes (Signed)
Patient ID: Matthew Henry, male   DOB: 1980/06/21, 35 y.o.   MRN: [id number] Wife wants him to be transferred  To [facility]. Will call the neurosurgical resident on call

## 2015-06-23 NOTE — ED Notes (Signed)
Pt has had intermittent migraines x 6 weeks.  Tonight pt woke up w/ headache, +nausea, +sensitivity to light and sound, emesis w/ streaks of blood.  Rates pain 10/10, stabbing in nature.  States pain is localized to the base of skull and behind left eye.  +blurred vision, difficulty focusing.  While vomiting tonight pt fell and struck his head on the bathroom wall, denies LOC.

## 2015-06-23 NOTE — Progress Notes (Signed)
Staff was called into the room, where wife expressed concerns of new onset expressive aphasia. Dr. Joya Salm informed of change and orders received for Narcan 0.2 mg IVP times one and may repeat after 3 minutes if no improvement. After orders received, assessed the patient and is neurologically intact, no evidence of expressive aphasia, and answering questions approprately. Narcan 0.2 mg ivp given, but not repeated. Patient's neurological assessment did not change from author's prior assessments. Will continue to monitor and await [facility] for transport to [facility].

## 2015-06-23 NOTE — Progress Notes (Signed)
Patient ID: Matthew Henry, male   DOB: June 01, 1980, 35 y.o.   MRN: [id number] Spoke with his wife and showed the mri as well the radiological report. She is fully aware that the tumor is most likely a malignant tumor which has been growing for a while. Also talked about the edema and shift. Patient is neurologically intact. My goal is to treat his edema with decadron with the hope that the patient can give [place] his input and be able to see his kids before a surgical intervention. She is also aware the if he declines i will take him to surgery anytime to do a gross resection. Also i gave her the choice to [facility]. i will continue to be on call till tomorrow Sunday at 3 pm.

## 2015-06-23 NOTE — ED Notes (Signed)
Report given to Wyn Quaker RN

## 2015-09-18 ENCOUNTER — Other Ambulatory Visit: Payer: Self-pay

## 2015-09-18 ENCOUNTER — Other Ambulatory Visit (INDEPENDENT_AMBULATORY_CARE_PROVIDER_SITE_OTHER): Payer: BLUE CROSS/BLUE SHIELD

## 2015-09-18 DIAGNOSIS — C719 Malignant neoplasm of brain, unspecified: Secondary | ICD-10-CM

## 2015-09-19 LAB — CBC WITH DIFFERENTIAL
Basophils Absolute: 0 10*3/uL (ref 0.0–0.2)
Basos: 1 %
EOS (ABSOLUTE): 0.1 10*3/uL (ref 0.0–0.4)
EOS: 4 %
HEMATOCRIT: 38 % (ref 37.5–51.0)
Hemoglobin: 12.5 g/dL — ABNORMAL LOW (ref 12.6–17.7)
IMMATURE GRANULOCYTES: 0 %
Immature Grans (Abs): 0 10*3/uL (ref 0.0–0.1)
LYMPHS ABS: 0.4 10*3/uL — AB (ref 0.7–3.1)
Lymphs: 24 %
MCH: 29.2 pg (ref 26.6–33.0)
MCHC: 32.9 g/dL (ref 31.5–35.7)
MCV: 89 fL (ref 79–97)
MONOS ABS: 0.1 10*3/uL (ref 0.1–0.9)
Monocytes: 9 %
NEUTROS PCT: 62 %
Neutrophils Absolute: 1 10*3/uL — ABNORMAL LOW (ref 1.4–7.0)
RBC: 4.28 x10E6/uL (ref 4.14–5.80)
RDW: 16.1 % — AB (ref 12.3–15.4)
WBC: 1.6 10*3/uL — AB (ref 3.4–10.8)

## 2015-12-21 DIAGNOSIS — C712 Malignant neoplasm of temporal lobe: Secondary | ICD-10-CM | POA: Diagnosis not present

## 2016-01-02 ENCOUNTER — Other Ambulatory Visit (INDEPENDENT_AMBULATORY_CARE_PROVIDER_SITE_OTHER): Payer: BLUE CROSS/BLUE SHIELD

## 2016-01-02 ENCOUNTER — Telehealth: Payer: Self-pay | Admitting: Family Medicine

## 2016-01-02 DIAGNOSIS — D6959 Other secondary thrombocytopenia: Secondary | ICD-10-CM | POA: Diagnosis not present

## 2016-01-02 DIAGNOSIS — T50905A Adverse effect of unspecified drugs, medicaments and biological substances, initial encounter: Secondary | ICD-10-CM | POA: Diagnosis not present

## 2016-01-02 NOTE — Telephone Encounter (Signed)
Pt has been scheduled.  °

## 2016-01-02 NOTE — Telephone Encounter (Signed)
° ° °  Pt has an order from [facility] to have labs drawn and is asking if it will be ok for him to come here and have this done l

## 2016-01-02 NOTE — Telephone Encounter (Signed)
YES--please schedule.

## 2016-01-03 LAB — CBC WITH DIFFERENTIAL/PLATELET
BASOS PCT: 0 %
Basophils Absolute: 0 cells/uL (ref 0–200)
Eosinophils Absolute: 39 cells/uL (ref 15–500)
Eosinophils Relative: 3 %
HEMATOCRIT: 30.1 % — AB (ref 38.5–50.0)
HEMOGLOBIN: 10.3 g/dL — AB (ref 13.2–17.1)
LYMPHS ABS: 611 {cells}/uL — AB (ref 850–3900)
Lymphocytes Relative: 47 %
MCH: 32.6 pg (ref 27.0–33.0)
MCHC: 34.2 g/dL (ref 32.0–36.0)
MCV: 95.3 fL (ref 80.0–100.0)
MONO ABS: 130 {cells}/uL — AB (ref 200–950)
MPV: 10.6 fL (ref 7.5–12.5)
Monocytes Relative: 10 %
NEUTROS PCT: 40 %
Neutro Abs: 520 cells/uL — ABNORMAL LOW (ref 1500–7800)
Platelets: 62 10*3/uL — ABNORMAL LOW (ref 140–400)
RBC: 3.16 MIL/uL — AB (ref 4.20–5.80)
RDW: 14.4 % (ref 11.0–15.0)
WBC: 1.3 10*3/uL — AB (ref 3.8–10.8)

## 2016-01-03 LAB — PATHOLOGIST SMEAR REVIEW

## 2016-01-04 NOTE — Telephone Encounter (Signed)
Lab results sent to [facility].

## 2016-01-04 NOTE — Telephone Encounter (Signed)
Pt would like to have lab results and also sent to [facility].

## 2016-01-10 DIAGNOSIS — C719 Malignant neoplasm of brain, unspecified: Secondary | ICD-10-CM | POA: Diagnosis not present

## 2016-01-10 DIAGNOSIS — C713 Malignant neoplasm of parietal lobe: Secondary | ICD-10-CM | POA: Diagnosis not present

## 2016-01-21 DIAGNOSIS — C712 Malignant neoplasm of temporal lobe: Secondary | ICD-10-CM | POA: Diagnosis not present

## 2016-02-01 DIAGNOSIS — C719 Malignant neoplasm of brain, unspecified: Secondary | ICD-10-CM | POA: Diagnosis not present

## 2016-02-01 DIAGNOSIS — R509 Fever, unspecified: Secondary | ICD-10-CM | POA: Diagnosis not present

## 2016-02-01 DIAGNOSIS — J101 Influenza due to other identified influenza virus with other respiratory manifestations: Secondary | ICD-10-CM | POA: Diagnosis not present

## 2016-02-05 ENCOUNTER — Other Ambulatory Visit: Payer: BLUE CROSS/BLUE SHIELD

## 2016-02-19 ENCOUNTER — Telehealth: Payer: Self-pay | Admitting: Family Medicine

## 2016-02-19 DIAGNOSIS — C712 Malignant neoplasm of temporal lobe: Secondary | ICD-10-CM | POA: Diagnosis not present

## 2016-02-19 NOTE — Telephone Encounter (Signed)
Pt would like to have a call to speak with you concerning the tumor that he is getting treatment for from [facility].

## 2016-02-22 ENCOUNTER — Other Ambulatory Visit: Payer: BLUE CROSS/BLUE SHIELD

## 2016-02-22 NOTE — Telephone Encounter (Signed)
Spoke with pt and on January 10, 2016 pt had a Focal Seizure with aphagia. Went to the ER and he was told that he should not drive. He spoke with [facility] that is following his brain tumor and she felt that he should be okay to drive but did not want to go against what her college suggested. He was told to follow up with you to discuss. Pending appointment on 03/04/16 at 8:15.

## 2016-03-04 ENCOUNTER — Ambulatory Visit (INDEPENDENT_AMBULATORY_CARE_PROVIDER_SITE_OTHER): Payer: BLUE CROSS/BLUE SHIELD | Admitting: Family Medicine

## 2016-03-04 ENCOUNTER — Encounter: Payer: Self-pay | Admitting: Family Medicine

## 2016-03-04 VITALS — BP 90/50 | HR 90 | Ht 72.0 in | Wt 168.0 lb

## 2016-03-04 DIAGNOSIS — T50905A Adverse effect of unspecified drugs, medicaments and biological substances, initial encounter: Secondary | ICD-10-CM

## 2016-03-04 DIAGNOSIS — D6959 Other secondary thrombocytopenia: Secondary | ICD-10-CM | POA: Diagnosis not present

## 2016-03-04 DIAGNOSIS — C719 Malignant neoplasm of brain, unspecified: Secondary | ICD-10-CM | POA: Diagnosis not present

## 2016-03-04 LAB — CBC WITH DIFFERENTIAL/PLATELET
BASOS PCT: 0.5 % (ref 0.0–3.0)
Basophils Absolute: 0 10*3/uL (ref 0.0–0.1)
EOS PCT: 2 % (ref 0.0–5.0)
Eosinophils Absolute: 0 10*3/uL (ref 0.0–0.7)
HCT: 35.2 % — ABNORMAL LOW (ref 39.0–52.0)
HEMOGLOBIN: 12.3 g/dL — AB (ref 13.0–17.0)
Lymphocytes Relative: 29.1 % (ref 12.0–46.0)
Lymphs Abs: 0.5 10*3/uL — ABNORMAL LOW (ref 0.7–4.0)
MCHC: 34.9 g/dL (ref 30.0–36.0)
MCV: 95 fl (ref 78.0–100.0)
MONO ABS: 0.1 10*3/uL (ref 0.1–1.0)
Monocytes Relative: 7.3 % (ref 3.0–12.0)
Neutro Abs: 1.1 10*3/uL — ABNORMAL LOW (ref 1.4–7.7)
Neutrophils Relative %: 61.1 % (ref 43.0–77.0)
Platelets: 120 10*3/uL — ABNORMAL LOW (ref 150.0–400.0)
RBC: 3.7 Mil/uL — ABNORMAL LOW (ref 4.22–5.81)
RDW: 15.4 % (ref 11.5–15.5)

## 2016-03-04 NOTE — Progress Notes (Signed)
Subjective:     Patient ID: Matthew Henry, male   DOB: [birth year], 36 y.o.   MRN: [id number]  HPI Patient has history of right temporal glioblastoma and underwent craniotomy and resection of tumor 06/27/2015 at [facility]. He's also had radiation and chemotherapy.  Back on December 7 he was in the kitchen helping to clean some dishes and he had difficulty finding his words and had some transient tingling sensation of his left leg. This lasted for a few minutes. There was never any loss of consciousness. He has full recollection. He never had any issues with information processing. He had some disarticulation but no weakness. They went to [facility] and had follow up scans.  No bleed and no CVA.  There was concern for possible focal seizure. He was placed by neuro-oncology on Keppra. He's had no episodes whatsoever since then.  He has not been driving since then.  Past Medical History:  Diagnosis Date  . Migraines    No past surgical history on file.  reports that he has never smoked. He has never used smokeless tobacco. He reports that he drinks alcohol. He reports that he does not use drugs. family history is not on file. No Known Allergies   Review of Systems  Constitutional: Negative for chills and fever.  Respiratory: Negative for shortness of breath.   Cardiovascular: Negative for chest pain.  Neurological: Negative for dizziness, tremors, syncope, weakness and headaches.  Psychiatric/Behavioral: Negative for confusion.       Objective:   Physical Exam  Constitutional: He is oriented to person, place, and time. He appears well-developed and well-nourished.  Eyes: Pupils are equal, round, and reactive to light.  Neck: Neck supple.  Cardiovascular: Normal rate and regular rhythm.   Pulmonary/Chest: Effort normal and breath sounds normal. No respiratory distress. He has no wheezes. He has no rales.  Neurological: He is alert and oriented to person, place, and time. No cranial nerve deficit.  Coordination normal.  Full strength throughout.  Psychiatric: He has a normal mood and affect. His behavior is normal.       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    Plan:     -Patient will continue close follow-up at [place] DMV laws on seizure and no driving with 6 months interval from recent seizure  -discussed recommendations for patient reporting of any possible seizure activity to [facility] DMV -He did not have #1) any loss of consciousness  #2) any motor weakness or #3) any impairment in formation processing.  Therefore, this puts him into lower risk status.  Eulas Post MD [facility]

## 2016-03-04 NOTE — Progress Notes (Signed)
Pre visit review using our clinic review tool, if applicable. No additional management support is needed unless otherwise documented below in the visit note. 

## 2016-03-18 DIAGNOSIS — C712 Malignant neoplasm of temporal lobe: Secondary | ICD-10-CM | POA: Diagnosis not present

## 2016-03-18 DIAGNOSIS — C719 Malignant neoplasm of brain, unspecified: Secondary | ICD-10-CM | POA: Diagnosis not present

## 2016-03-20 ENCOUNTER — Other Ambulatory Visit: Payer: Self-pay

## 2016-03-20 DIAGNOSIS — C719 Malignant neoplasm of brain, unspecified: Secondary | ICD-10-CM

## 2016-03-31 ENCOUNTER — Other Ambulatory Visit (INDEPENDENT_AMBULATORY_CARE_PROVIDER_SITE_OTHER): Payer: BLUE CROSS/BLUE SHIELD

## 2016-03-31 DIAGNOSIS — C719 Malignant neoplasm of brain, unspecified: Secondary | ICD-10-CM | POA: Diagnosis not present

## 2016-03-31 DIAGNOSIS — T50905A Adverse effect of unspecified drugs, medicaments and biological substances, initial encounter: Secondary | ICD-10-CM

## 2016-03-31 DIAGNOSIS — D6959 Other secondary thrombocytopenia: Secondary | ICD-10-CM | POA: Diagnosis not present

## 2016-03-31 LAB — CBC WITH DIFFERENTIAL/PLATELET
BASOS ABS: 0 10*3/uL (ref 0.0–0.1)
Basophils Relative: 1.3 % (ref 0.0–3.0)
EOS PCT: 1.4 % (ref 0.0–5.0)
Eosinophils Absolute: 0 10*3/uL (ref 0.0–0.7)
HCT: 40 % (ref 39.0–52.0)
HEMOGLOBIN: 13.7 g/dL (ref 13.0–17.0)
LYMPHS PCT: 22.3 % (ref 12.0–46.0)
Lymphs Abs: 0.6 10*3/uL — ABNORMAL LOW (ref 0.7–4.0)
MCHC: 34.2 g/dL (ref 30.0–36.0)
MCV: 96.6 fl (ref 78.0–100.0)
Monocytes Absolute: 0.2 10*3/uL (ref 0.1–1.0)
Monocytes Relative: 8.6 % (ref 3.0–12.0)
Neutro Abs: 1.9 10*3/uL (ref 1.4–7.7)
Neutrophils Relative %: 66.4 % (ref 43.0–77.0)
Platelets: 192 10*3/uL (ref 150.0–400.0)
RBC: 4.14 Mil/uL — AB (ref 4.22–5.81)
RDW: 14.8 % (ref 11.5–15.5)
WBC: 2.8 10*3/uL — AB (ref 4.0–10.5)

## 2016-03-31 LAB — COMPREHENSIVE METABOLIC PANEL
ALBUMIN: 4.5 g/dL (ref 3.5–5.2)
ALK PHOS: 31 U/L — AB (ref 39–117)
ALT: 17 U/L (ref 0–53)
AST: 18 U/L (ref 0–37)
BILIRUBIN TOTAL: 1.2 mg/dL (ref 0.2–1.2)
BUN: 15 mg/dL (ref 6–23)
CO2: 29 mEq/L (ref 19–32)
Calcium: 9.3 mg/dL (ref 8.4–10.5)
Chloride: 104 mEq/L (ref 96–112)
Creatinine, Ser: 1 mg/dL (ref 0.40–1.50)
GFR: 90.86 mL/min (ref >60.00)
Glucose, Bld: 87 mg/dL (ref 70–99)
POTASSIUM: 4 meq/L (ref 3.5–5.1)
Sodium: 141 mEq/L (ref 135–145)
TOTAL PROTEIN: 6.4 g/dL (ref 6.0–8.3)

## 2016-04-09 DIAGNOSIS — D696 Thrombocytopenia, unspecified: Secondary | ICD-10-CM | POA: Diagnosis not present

## 2016-04-09 DIAGNOSIS — C712 Malignant neoplasm of temporal lobe: Secondary | ICD-10-CM | POA: Diagnosis not present

## 2016-04-21 ENCOUNTER — Other Ambulatory Visit: Payer: BLUE CROSS/BLUE SHIELD

## 2016-04-22 ENCOUNTER — Other Ambulatory Visit (INDEPENDENT_AMBULATORY_CARE_PROVIDER_SITE_OTHER): Payer: BLUE CROSS/BLUE SHIELD

## 2016-04-22 DIAGNOSIS — D6959 Other secondary thrombocytopenia: Secondary | ICD-10-CM

## 2016-04-22 DIAGNOSIS — T50905A Adverse effect of unspecified drugs, medicaments and biological substances, initial encounter: Secondary | ICD-10-CM | POA: Diagnosis not present

## 2016-04-22 LAB — CBC WITH DIFFERENTIAL/PLATELET
BASOS ABS: 0 10*3/uL (ref 0.0–0.1)
Basophils Relative: 0.7 % (ref 0.0–3.0)
EOS PCT: 4.1 % (ref 0.0–5.0)
Eosinophils Absolute: 0.1 10*3/uL (ref 0.0–0.7)
HCT: 45.5 % (ref 39.0–52.0)
HEMOGLOBIN: 15.4 g/dL (ref 13.0–17.0)
LYMPHS ABS: 0.7 10*3/uL (ref 0.7–4.0)
Lymphocytes Relative: 20.7 % (ref 12.0–46.0)
MCHC: 33.9 g/dL (ref 30.0–36.0)
MCV: 94.7 fl (ref 78.0–100.0)
MONOS PCT: 9 % (ref 3.0–12.0)
Monocytes Absolute: 0.3 10*3/uL (ref 0.1–1.0)
NEUTROS PCT: 65.5 % (ref 43.0–77.0)
Neutro Abs: 2.1 10*3/uL (ref 1.4–7.7)
Platelets: 178 10*3/uL (ref 150.0–400.0)
RBC: 4.8 Mil/uL (ref 4.22–5.81)
RDW: 13.6 % (ref 11.5–15.5)
WBC: 3.2 10*3/uL — AB (ref 4.0–10.5)

## 2016-05-13 DIAGNOSIS — C719 Malignant neoplasm of brain, unspecified: Secondary | ICD-10-CM | POA: Diagnosis not present

## 2016-05-13 DIAGNOSIS — C712 Malignant neoplasm of temporal lobe: Secondary | ICD-10-CM | POA: Diagnosis not present

## 2016-05-26 ENCOUNTER — Other Ambulatory Visit (INDEPENDENT_AMBULATORY_CARE_PROVIDER_SITE_OTHER): Payer: BLUE CROSS/BLUE SHIELD

## 2016-05-26 DIAGNOSIS — C719 Malignant neoplasm of brain, unspecified: Secondary | ICD-10-CM

## 2016-05-26 LAB — CBC WITH DIFFERENTIAL/PLATELET
BASOS PCT: 1 % (ref 0.0–3.0)
Basophils Absolute: 0 10*3/uL (ref 0.0–0.1)
EOS ABS: 0.1 10*3/uL (ref 0.0–0.7)
EOS PCT: 3.6 % (ref 0.0–5.0)
HEMATOCRIT: 41.4 % (ref 39.0–52.0)
HEMOGLOBIN: 14.2 g/dL (ref 13.0–17.0)
LYMPHS PCT: 22.2 % (ref 12.0–46.0)
Lymphs Abs: 0.6 10*3/uL — ABNORMAL LOW (ref 0.7–4.0)
MCHC: 34.4 g/dL (ref 30.0–36.0)
MCV: 93.4 fl (ref 78.0–100.0)
Monocytes Absolute: 0.3 10*3/uL (ref 0.1–1.0)
Monocytes Relative: 10.2 % (ref 3.0–12.0)
Neutro Abs: 1.6 10*3/uL (ref 1.4–7.7)
Neutrophils Relative %: 63 % (ref 43.0–77.0)
Platelets: 160 10*3/uL (ref 150.0–400.0)
RBC: 4.43 Mil/uL (ref 4.22–5.81)
RDW: 12.6 % (ref 11.5–15.5)
WBC: 2.5 10*3/uL — AB (ref 4.0–10.5)

## 2016-06-10 DIAGNOSIS — C711 Malignant neoplasm of frontal lobe: Secondary | ICD-10-CM | POA: Diagnosis not present

## 2016-07-08 DIAGNOSIS — C712 Malignant neoplasm of temporal lobe: Secondary | ICD-10-CM | POA: Diagnosis not present

## 2016-07-08 DIAGNOSIS — C719 Malignant neoplasm of brain, unspecified: Secondary | ICD-10-CM | POA: Diagnosis not present

## 2016-08-13 DIAGNOSIS — C712 Malignant neoplasm of temporal lobe: Secondary | ICD-10-CM | POA: Diagnosis not present

## 2016-09-02 DIAGNOSIS — C712 Malignant neoplasm of temporal lobe: Secondary | ICD-10-CM | POA: Diagnosis not present

## 2016-09-02 DIAGNOSIS — C719 Malignant neoplasm of brain, unspecified: Secondary | ICD-10-CM | POA: Diagnosis not present

## 2016-09-30 DIAGNOSIS — C712 Malignant neoplasm of temporal lobe: Secondary | ICD-10-CM | POA: Diagnosis not present

## 2016-10-21 DIAGNOSIS — L03317 Cellulitis of buttock: Secondary | ICD-10-CM | POA: Diagnosis not present

## 2016-10-22 ENCOUNTER — Ambulatory Visit: Payer: BLUE CROSS/BLUE SHIELD | Admitting: Family Medicine

## 2016-10-23 ENCOUNTER — Encounter: Payer: Self-pay | Admitting: Family Medicine

## 2016-11-11 DIAGNOSIS — C719 Malignant neoplasm of brain, unspecified: Secondary | ICD-10-CM | POA: Diagnosis not present

## 2016-11-11 DIAGNOSIS — C712 Malignant neoplasm of temporal lobe: Secondary | ICD-10-CM | POA: Diagnosis not present

## 2016-12-12 DIAGNOSIS — C712 Malignant neoplasm of temporal lobe: Secondary | ICD-10-CM | POA: Diagnosis not present

## 2016-12-12 DIAGNOSIS — C719 Malignant neoplasm of brain, unspecified: Secondary | ICD-10-CM | POA: Diagnosis not present

## 2017-01-20 DIAGNOSIS — C719 Malignant neoplasm of brain, unspecified: Secondary | ICD-10-CM | POA: Diagnosis not present

## 2017-01-20 DIAGNOSIS — C712 Malignant neoplasm of temporal lobe: Secondary | ICD-10-CM | POA: Diagnosis not present

## 2017-01-29 DIAGNOSIS — B348 Other viral infections of unspecified site: Secondary | ICD-10-CM | POA: Diagnosis not present

## 2017-03-23 DIAGNOSIS — C719 Malignant neoplasm of brain, unspecified: Secondary | ICD-10-CM | POA: Diagnosis not present

## 2017-03-23 DIAGNOSIS — C712 Malignant neoplasm of temporal lobe: Secondary | ICD-10-CM | POA: Diagnosis not present

## 2017-05-18 DIAGNOSIS — C719 Malignant neoplasm of brain, unspecified: Secondary | ICD-10-CM | POA: Diagnosis not present

## 2017-05-18 DIAGNOSIS — C712 Malignant neoplasm of temporal lobe: Secondary | ICD-10-CM | POA: Diagnosis not present

## 2017-07-20 DIAGNOSIS — C712 Malignant neoplasm of temporal lobe: Secondary | ICD-10-CM | POA: Diagnosis not present

## 2017-07-20 DIAGNOSIS — C719 Malignant neoplasm of brain, unspecified: Secondary | ICD-10-CM | POA: Diagnosis not present

## 2017-09-21 DIAGNOSIS — C719 Malignant neoplasm of brain, unspecified: Secondary | ICD-10-CM | POA: Diagnosis not present

## 2017-09-30 ENCOUNTER — Ambulatory Visit: Payer: BLUE CROSS/BLUE SHIELD | Admitting: Family Medicine

## 2017-09-30 ENCOUNTER — Encounter: Payer: Self-pay | Admitting: Family Medicine

## 2017-09-30 VITALS — BP 110/70 | HR 82 | Temp 98.3°F | Wt 181.1 lb

## 2017-09-30 DIAGNOSIS — L570 Actinic keratosis: Secondary | ICD-10-CM

## 2017-09-30 MED ORDER — MUPIROCIN 2 % EX OINT: 1.0000 "application " | TOPICAL_OINTMENT | Freq: Two times a day (BID) | CUTANEOUS | 0 refills | Status: DC

## 2017-09-30 NOTE — Patient Instructions (Signed)
Use the Bactroban twice daily  Let me know in two weeks if not fully resolved.

## 2017-09-30 NOTE — Progress Notes (Signed)
  Subjective:     Patient ID: Matthew Henry, male   DOB: [birth year], 37 y.o.   MRN: [id number]  HPI Patient has history of glioblastoma and is followed at [facility]. He had his last treatment with chemotherapy and radiation back the severed 2018. He is followed with every 2 month MRIs. No recent headaches. No signs of any progression of disease. He is here today with what he describes as a "rough area "inside his right nostril near the opening. Denies any history of injury. No bleeding. He's had some occasional clear discharge which may be more allergy related. No purulent secretions. No pain.  He wondered initially if this may be some type of ingrown hair.    Appetite and weight have been stable. He continues to exercise regularly.  Past Medical History:  Diagnosis Date  . Migraines    No past surgical history on file.  reports that he has never smoked. He has never used smokeless tobacco. He reports that he drinks alcohol. He reports that he does not use drugs. family history is not on file. No Known Allergies   Review of Systems  Constitutional: Negative for appetite change and unexpected weight change.  HENT: Negative for nosebleeds, postnasal drip, sinus pressure and sinus pain.   Hematological: Negative for adenopathy.       Objective:   Physical Exam  Constitutional: He appears well-developed and well-nourished.  HENT:  Right Ear: External ear normal.  Left Ear: External ear normal.  Left naris is clear. Right naris near the opening inferiorly he has very small approximately 2 mm slightly hyperkeratotic area. No nodular growth. No bleeding. No ulceration. Non-tender.  Cardiovascular: Normal rate and regular rhythm.  Pulmonary/Chest: Effort normal and breath sounds normal.       Assessment:     Small hyperkeratotic type area just inside right naris. Etiology unclear. He does not have any evidence for ulceration or bleeding    Plan:     -recommended apply  Bactroban ointment twice daily for the next several days and he will touch base if this is not fully resolved in 2 weeks. Consider ENT referral 2 weeks if not resolving  Eulas Post MD [facility]

## 2017-12-01 DIAGNOSIS — C719 Malignant neoplasm of brain, unspecified: Secondary | ICD-10-CM | POA: Diagnosis not present

## 2018-01-30 DIAGNOSIS — J101 Influenza due to other identified influenza virus with other respiratory manifestations: Secondary | ICD-10-CM | POA: Diagnosis not present

## 2018-03-02 DIAGNOSIS — C719 Malignant neoplasm of brain, unspecified: Secondary | ICD-10-CM | POA: Diagnosis not present

## 2018-04-10 IMAGING — CT CT HEAD W/O CM
2 series · 16 of 30 positions shown, 19 images · non-contrast
Comparison: None.

CLINICAL DATA: Intermittent migraines x6 weeks, headache this
morning with nausea, vomiting, and photosensitivity

EXAM:
CT HEAD WITHOUT CONTRAST
TECHNIQUE: Contiguous axial images were obtained from the base of the skull
through the vertex without intravenous contrast.

[Series 2: head w/o · axial · non-contrast · 0.45mm/px · z∈[-169,-59]mm · 9 of 28 slices shown, 12 images]
[im 3/28  brain]
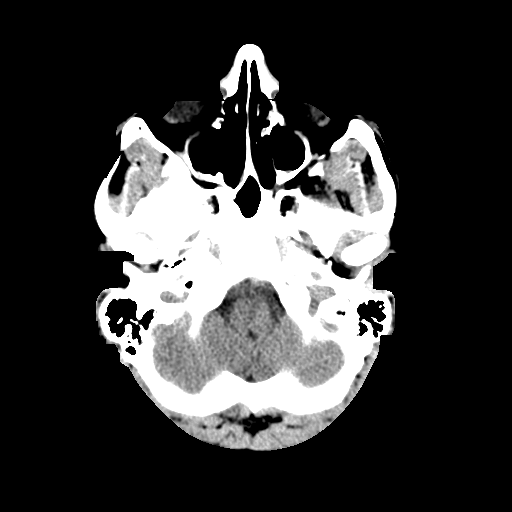
[im 3/28  bone]
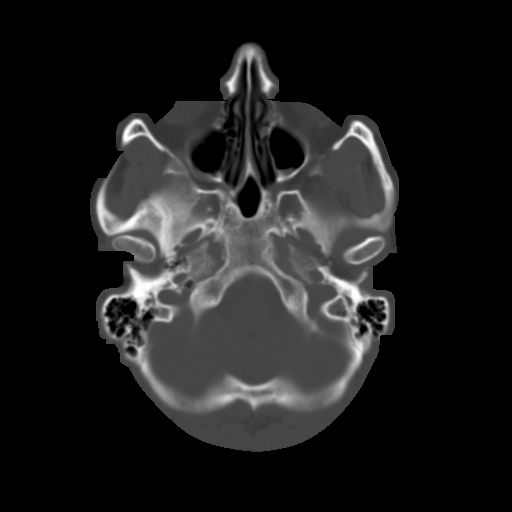
[im 6/28  brain]
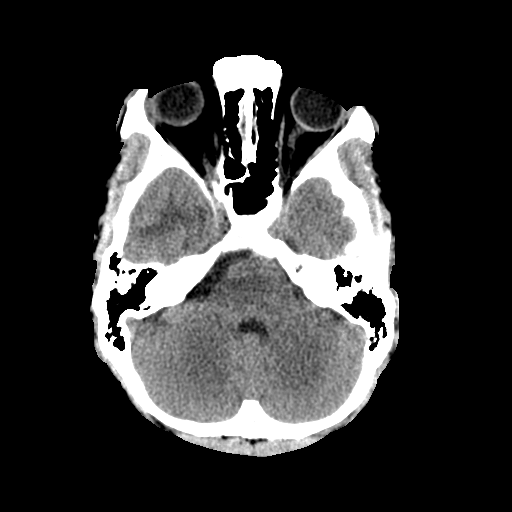
[im 9/28  brain]
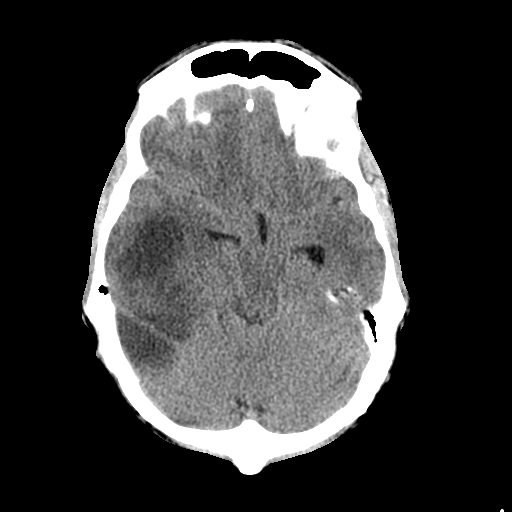
[im 11/28  brain]
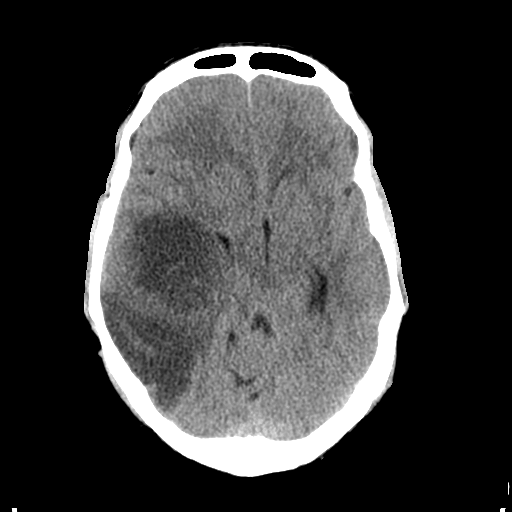
[im 14/28  brain]
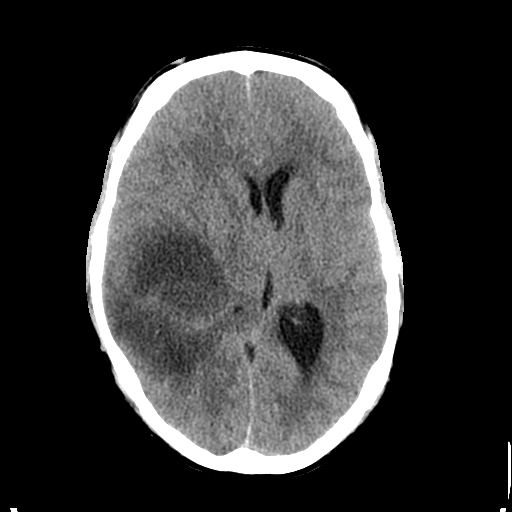
[im 14/28  bone]
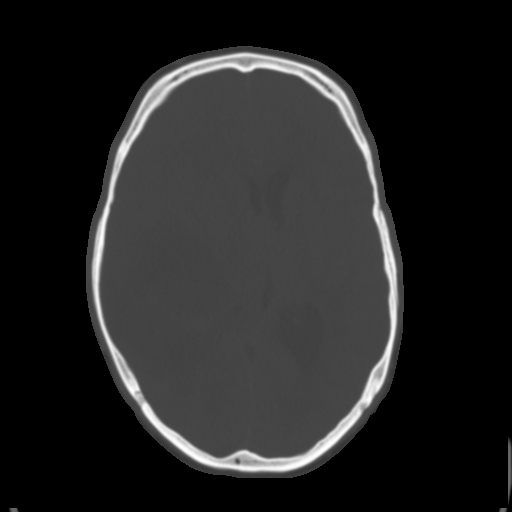
[im 17/28  brain]
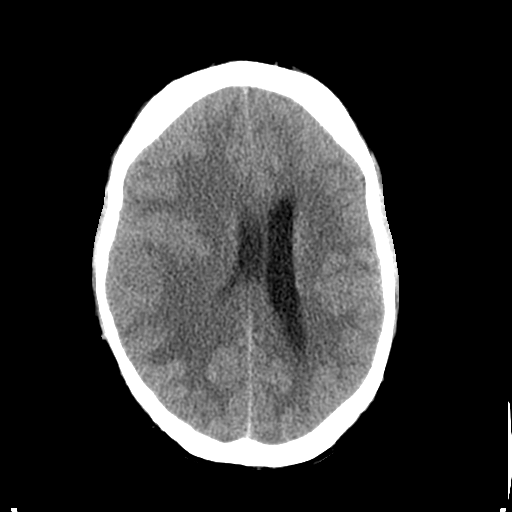
[im 19/28  brain]
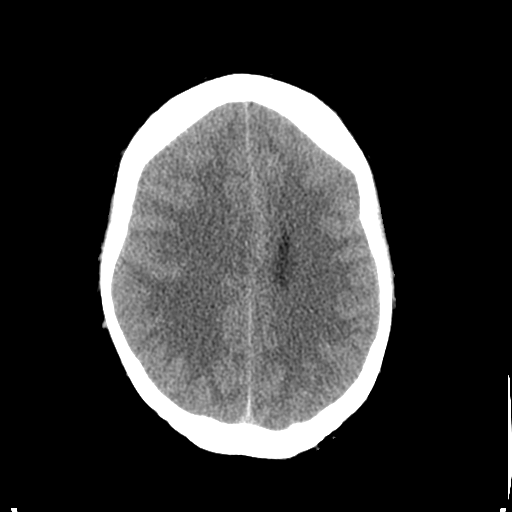
[im 22/28  brain]
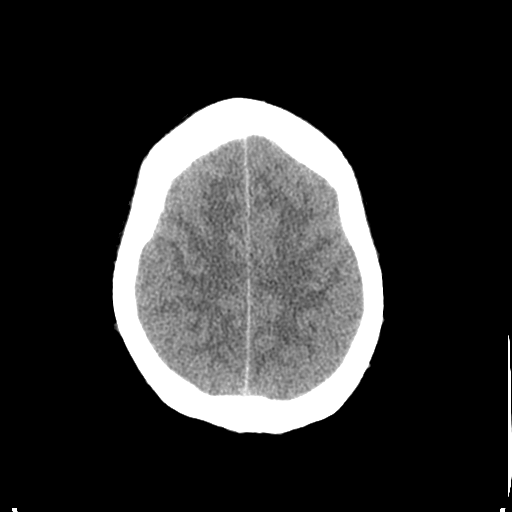
[im 25/28  brain]
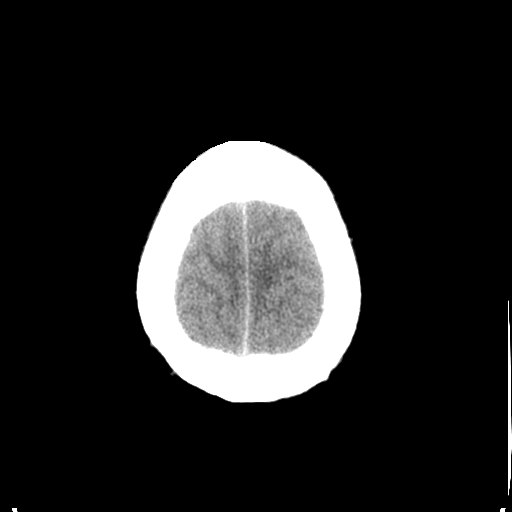
[im 25/28  bone]
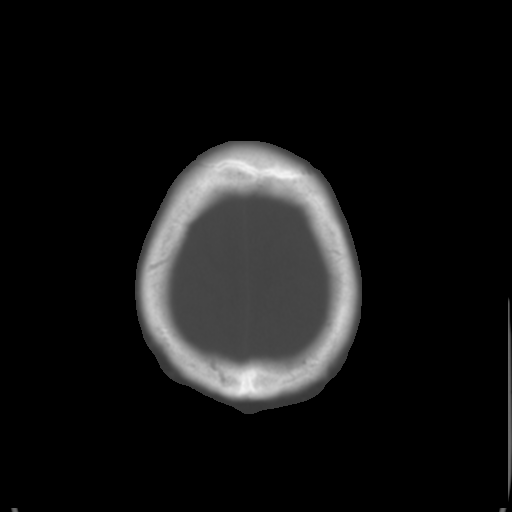

[Series 3: bone windows · axial · 0.45mm/px · z∈[-164,-74]mm · 7 of 46 slices shown]
[im 6/46  bone]
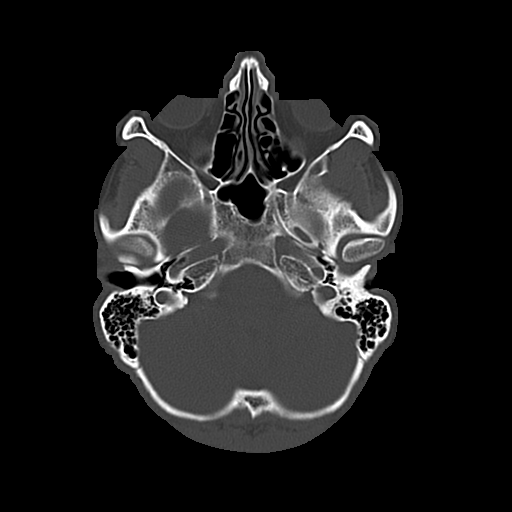
[im 11/46  bone]
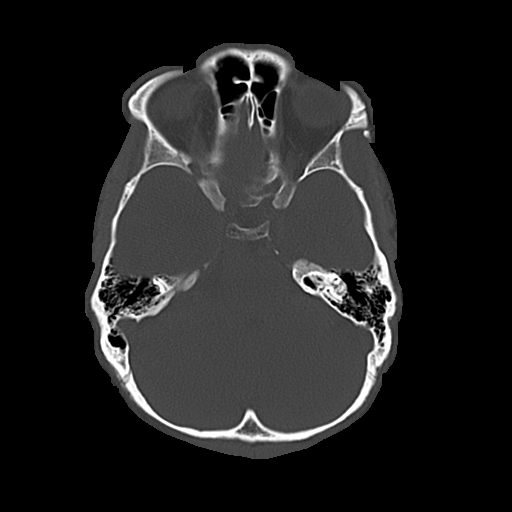
[im 16/46  bone]
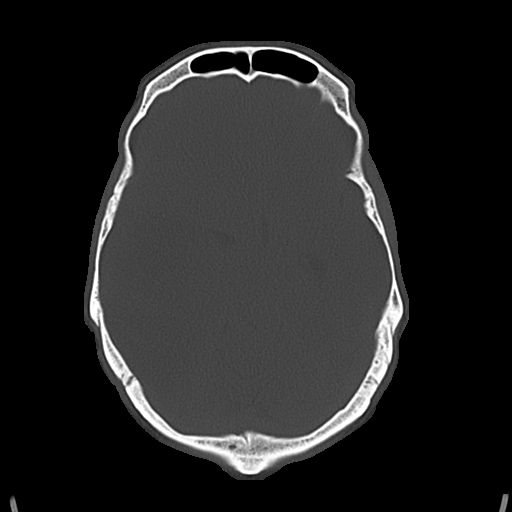
[im 21/46  bone]
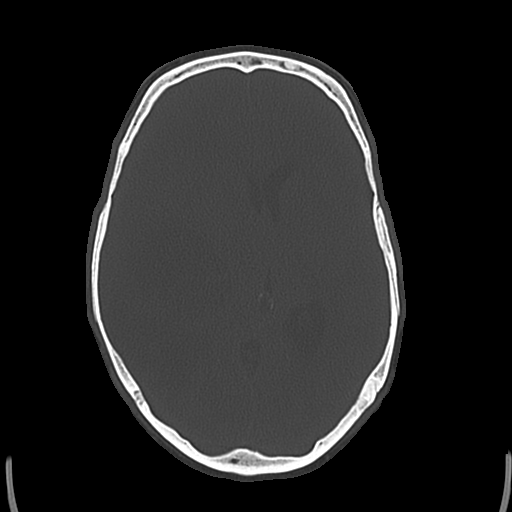
[im 26/46  bone]
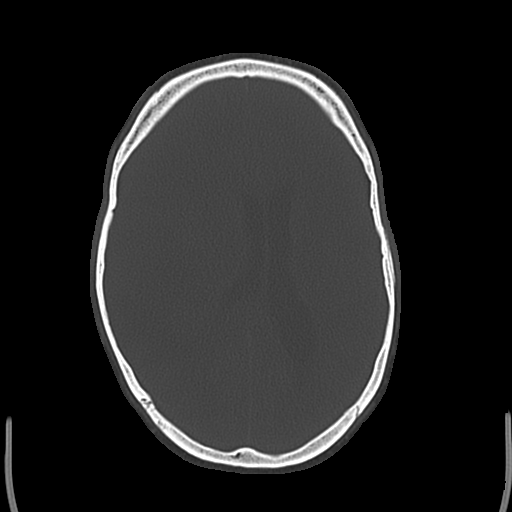
[im 31/46  bone]
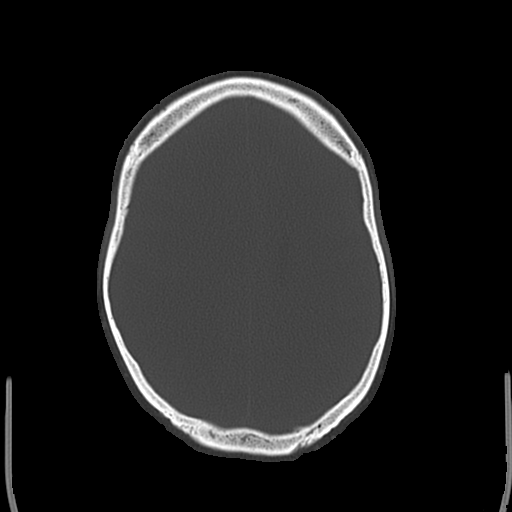
[im 36/46  bone]
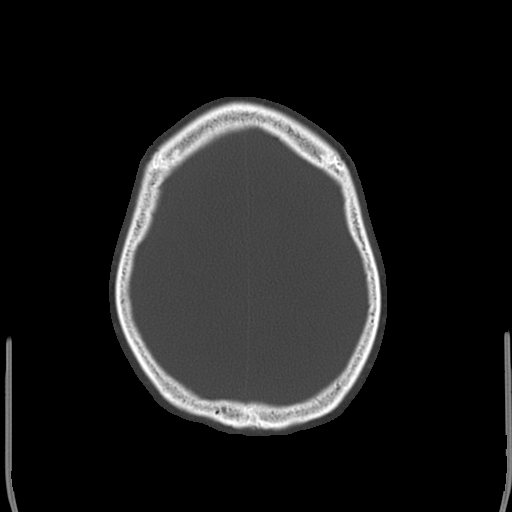

[16 of 30 positions shown; findings below may reference images not displayed]

FINDINGS: 8.1 x 5.4 cm hypodense mass centered in the right parietal lobe.
However, additional signal abnormality/edema extends into the
temporal lobe (series 2/image 7) as well as the occipital lobe
(series 2/image 14).

Associated mass effect/sulcal effacement. 10 mm right to left
midline shift (series 2/image 13).

Effacement of the basilar cisterns (series 2/image 7). Crowding of
the cerebellar tonsils at the foramen magnum, raising concern for
increased intracranial pressure.

Mass effect on the right lateral ventricle. Trapping of the temporal
horns bilaterally.

No evidence of parenchymal hemorrhage or extra-axial fluid
collection.

No CT evidence of acute infarction.

Layering fluid in the bilateral maxillary sinuses. Mastoid air cells
are clear.

No evidence of calvarial fracture.
IMPRESSION: 8.1 x 5.4 cm mass centered in the right parietal lobe.

Associated mass effect with 10 mm leftward midline shaft.

Effacement of the basilar cisterns. Crowding of the cerebellar
tonsils at the foramen magnum, raising concern for increased
intracranial pressure.

Critical Value/emergent results were called by telephone at the time
of interpretation on 06/23/2015 at [DATE] to Dr. VYSHAL TOORI
, who verbally acknowledged these results. Neurosurgery has already
been consulted.

## 2018-05-24 DIAGNOSIS — Z9889 Other specified postprocedural states: Secondary | ICD-10-CM | POA: Diagnosis not present

## 2018-05-24 DIAGNOSIS — C719 Malignant neoplasm of brain, unspecified: Secondary | ICD-10-CM | POA: Diagnosis not present

## 2018-08-20 DIAGNOSIS — C719 Malignant neoplasm of brain, unspecified: Secondary | ICD-10-CM | POA: Diagnosis not present

## 2018-08-24 DIAGNOSIS — C719 Malignant neoplasm of brain, unspecified: Secondary | ICD-10-CM | POA: Diagnosis not present

## 2018-11-30 DIAGNOSIS — Z9889 Other specified postprocedural states: Secondary | ICD-10-CM | POA: Diagnosis not present

## 2018-11-30 DIAGNOSIS — C719 Malignant neoplasm of brain, unspecified: Secondary | ICD-10-CM | POA: Diagnosis not present

## 2018-11-30 DIAGNOSIS — C712 Malignant neoplasm of temporal lobe: Secondary | ICD-10-CM | POA: Diagnosis not present

## 2020-05-01 ENCOUNTER — Encounter: Payer: Self-pay | Admitting: Family Medicine

## 2020-05-01 ENCOUNTER — Other Ambulatory Visit: Payer: Self-pay

## 2020-05-01 ENCOUNTER — Ambulatory Visit (INDEPENDENT_AMBULATORY_CARE_PROVIDER_SITE_OTHER): Payer: Managed Care, Other (non HMO) | Admitting: Family Medicine

## 2020-05-01 ENCOUNTER — Ambulatory Visit (INDEPENDENT_AMBULATORY_CARE_PROVIDER_SITE_OTHER): Payer: Managed Care, Other (non HMO)

## 2020-05-01 VITALS — BP 138/64 | HR 83 | Temp 98.0°F | Wt 188.6 lb

## 2020-05-01 DIAGNOSIS — L03031 Cellulitis of right toe: Secondary | ICD-10-CM

## 2020-05-01 DIAGNOSIS — M79674 Pain in right toe(s): Secondary | ICD-10-CM

## 2020-05-01 MED ORDER — CEPHALEXIN 500 MG PO CAPS: 500.0000 mg | ORAL_CAPSULE | Freq: Four times a day (QID) | ORAL | 0 refills | Status: DC

## 2020-05-01 NOTE — Patient Instructions (Signed)

## 2020-05-01 NOTE — Progress Notes (Signed)
Established Patient Office Visit  Subjective:  Patient ID: Matthew Henry, male    DOB: 11/20/1980  Age: 40 y.o. MRN: [id number]  CC:  Chief Complaint  Patient presents with  . Foot Injury    R great toe, feels sore/ bruised    HPI Matthew Henry presents for right great toe pain.  He states that Sunday 2 days ago and aerator has sharp spikes in the back rolled over onto his toe.  He had a type of boot on but still considerable amount of force pushed into the boot and into his toe.  When he took this off he noticed little bit of bleeding and very small area of puncture at the base of the toenail.  He has had some pain with weightbearing since then involving the great toe distally.  He thinks his tetanus was less than 10 years ago.  He is ambulating without much difficulty at this time.  No other injuries reported.  He has noticed little redness past couple days involving the dorsal aspect of the right great toe.  No fevers or chills.  Past Medical History:  Diagnosis Date  . Migraines     No past surgical history on file.  No family history on file.  Social History   Socioeconomic History  . Marital status: Married    Spouse name: Not on file  . Number of children: Not on file  . Years of education: Not on file  . Highest education level: Not on file  Occupational History  . Not on file  Tobacco Use  . Smoking status: Never Smoker  . Smokeless tobacco: Never Used  Substance and Sexual Activity  . Alcohol use: Yes    Comment: rare  . Drug use: No  . Sexual activity: Yes    Birth control/protection: None  Other Topics Concern  . Not on file  Social History Narrative  . Not on file   Social Determinants of Health   Financial Resource Strain: Not on file  Food Insecurity: Not on file  Transportation Needs: Not on file  Physical Activity: Not on file  Stress: Not on file  Social Connections: Not on file  Intimate Partner Violence: Not on file    Outpatient  Medications Prior to Visit  Medication Sig Dispense Refill  . mupirocin ointment (BACTROBAN) 2 % Place 1 application into the nose 2 (two) times daily. 22 g 0   No facility-administered medications prior to visit.    No Known Allergies  ROS Review of Systems  Constitutional: Negative for chills and fever.      Objective:    Physical Exam Vitals reviewed.  Constitutional:      Appearance: Normal appearance.  Cardiovascular:     Rate and Rhythm: Normal rate and regular rhythm.  Pulmonary:     Effort: Pulmonary effort is normal.     Breath sounds: Normal breath sounds.  Musculoskeletal:     Comments: Right great toe reveals some mild erythema involving most of the dorsal aspect of the toe specially distally.  No significant warmth.  He has some mild tenderness distally.  No subungual hematoma.  Nail is intact. Does not have any bony tenderness to palpation in the foot otherwise  Neurological:     Mental Status: He is alert.     BP 138/64 (BP Location: Left Arm, Patient Position: Sitting, Cuff Size: Normal)   Pulse 83   Temp 98 F (36.7 C) (Oral)   Wt 188 lb 9.6 oz (  85.5 kg)   SpO2 96%   BMI 25.58 kg/m  Wt Readings from Last 3 Encounters:  05/01/20 188 lb 9.6 oz (85.5 kg)  09/30/17 181 lb 1.6 oz (82.1 kg)  03/04/16 168 lb (76.2 kg)     Health Maintenance Due  Topic Date Due  . Hepatitis C Screening  Never done  . COVID-19 Vaccine (1) Never done  . HIV Screening  Never done  . TETANUS/TDAP  02/03/2010  . INFLUENZA VACCINE  Never done    There are no preventive care reminders to display for this patient.  Lab Results  Component Value Date   TSH 1.38 10/25/2012   Lab Results  Component Value Date   WBC 2.5 (L) 05/26/2016   HGB 14.2 05/26/2016   HCT 41.4 05/26/2016   MCV 93.4 05/26/2016   PLT 160.0 05/26/2016   Lab Results  Component Value Date   NA 141 03/31/2016   K 4.0 03/31/2016   CO2 29 03/31/2016   GLUCOSE 87 03/31/2016   BUN 15 03/31/2016    CREATININE 1.00 03/31/2016   BILITOT 1.2 03/31/2016   ALKPHOS 31 (L) 03/31/2016   AST 18 03/31/2016   ALT 17 03/31/2016   PROT 6.4 03/31/2016   ALBUMIN 4.5 03/31/2016   CALCIUM 9.3 03/31/2016   ANIONGAP 5 06/23/2015   GFR 90.86 03/31/2016   Lab Results  Component Value Date   CHOL 165 10/25/2012   Lab Results  Component Value Date   HDL 49.40 10/25/2012   Lab Results  Component Value Date   LDLCALC 111 (H) 10/25/2012   Lab Results  Component Value Date   TRIG 23.0 10/25/2012   Lab Results  Component Value Date   CHOLHDL 3 10/25/2012   No results found for: HGBA1C    Assessment & Plan:   Problem List Items Addressed This Visit   None   Visit Diagnoses    Great toe pain, right    -  Primary   Relevant Orders   DG Toe Great Right   Cellulitis of great toe of right foot        Patient has distal right great toe pain and evidence clinically for some mild cellulitis.  X-ray of toe obtained which shows no obvious fracture.  This will be over read by radiology.  -Elevate foot frequently -Start Keflex 500 mg 4 times daily for 7 days -Consider some topical heat 15 to 20 minutes a few times daily -Follow-up promptly for any fever, progressive redness, or other concerns  Meds ordered this encounter  Medications  . cephALEXin (KEFLEX) 500 MG capsule    Sig: Take 1 capsule (500 mg total) by mouth 4 (four) times daily.    Dispense:  28 capsule    Refill:  0    Follow-up: No follow-ups on file.    Carolann Littler, MD

## 2022-10-31 ENCOUNTER — Encounter: Payer: Managed Care, Other (non HMO) | Admitting: Family Medicine

## 2022-12-02 ENCOUNTER — Ambulatory Visit (INDEPENDENT_AMBULATORY_CARE_PROVIDER_SITE_OTHER): Payer: 59 | Admitting: Family Medicine

## 2022-12-02 ENCOUNTER — Encounter: Payer: Self-pay | Admitting: Family Medicine

## 2022-12-02 VITALS — BP 124/70 | HR 75 | Temp 97.5°F | Ht 72.44 in | Wt 185.9 lb

## 2022-12-02 DIAGNOSIS — Z23 Encounter for immunization: Secondary | ICD-10-CM | POA: Diagnosis not present

## 2022-12-02 DIAGNOSIS — Z0000 Encounter for general adult medical examination without abnormal findings: Secondary | ICD-10-CM

## 2022-12-02 LAB — HEPATIC FUNCTION PANEL
ALT: 15 U/L (ref 0–53)
AST: 16 U/L (ref 0–37)
Albumin: 4.6 g/dL (ref 3.5–5.2)
Alkaline Phosphatase: 37 U/L — ABNORMAL LOW (ref 39–117)
Bilirubin, Direct: 0.2 mg/dL (ref 0.0–0.3)
Total Bilirubin: 1.3 mg/dL — ABNORMAL HIGH (ref 0.2–1.2)
Total Protein: 7.3 g/dL (ref 6.0–8.3)

## 2022-12-02 LAB — CBC WITH DIFFERENTIAL/PLATELET
Basophils Absolute: 0 10*3/uL (ref 0.0–0.1)
Basophils Relative: 0.5 % (ref 0.0–3.0)
Eosinophils Absolute: 0.1 10*3/uL (ref 0.0–0.7)
Eosinophils Relative: 1.3 % (ref 0.0–5.0)
HCT: 44.9 % (ref 39.0–52.0)
Hemoglobin: 14.5 g/dL (ref 13.0–17.0)
Lymphocytes Relative: 27.6 % (ref 12.0–46.0)
Lymphs Abs: 1.5 10*3/uL (ref 0.7–4.0)
MCHC: 32.2 g/dL (ref 30.0–36.0)
MCV: 93 fL (ref 78.0–100.0)
Monocytes Absolute: 0.4 10*3/uL (ref 0.1–1.0)
Monocytes Relative: 7.7 % (ref 3.0–12.0)
Neutro Abs: 3.3 10*3/uL (ref 1.4–7.7)
Neutrophils Relative %: 62.9 % (ref 43.0–77.0)
Platelets: 232 10*3/uL (ref 150.0–400.0)
RBC: 4.83 Mil/uL (ref 4.22–5.81)
RDW: 13.3 % (ref 11.5–15.5)
WBC: 5.3 10*3/uL (ref 4.0–10.5)

## 2022-12-02 LAB — LIPID PANEL
Cholesterol: 203 mg/dL — ABNORMAL HIGH (ref 0–200)
HDL: 59.1 mg/dL (ref >39.00)
LDL Cholesterol: 135 mg/dL — ABNORMAL HIGH (ref 0–99)
NonHDL: 143.43
Total CHOL/HDL Ratio: 3
Triglycerides: 42 mg/dL (ref 0.0–149.0)
VLDL: 8.4 mg/dL (ref 0.0–40.0)

## 2022-12-02 LAB — BASIC METABOLIC PANEL
BUN: 17 mg/dL (ref 6–23)
CO2: 30 meq/L (ref 19–32)
Calcium: 9.8 mg/dL (ref 8.4–10.5)
Chloride: 103 meq/L (ref 96–112)
Creatinine, Ser: 0.97 mg/dL (ref 0.40–1.50)
GFR: 97.47 mL/min (ref >60.00)
Glucose, Bld: 98 mg/dL (ref 70–99)
Potassium: 4.9 meq/L (ref 3.5–5.1)
Sodium: 138 meq/L (ref 135–145)

## 2022-12-02 NOTE — Progress Notes (Signed)
Established Patient Office Visit  Subjective   Patient ID: Matthew Henry, male    DOB: Jan 19, 1981  Age: 42 y.o. MRN: [id number]  Chief Complaint  Patient presents with   Annual Exam    HPI   Matthew Henry is seen today for physical exam.  He has history of glioblastoma of the brain with surgery about 6 years ago at [facility] and has done remarkably well.  Gets every 43-month MRIs with no signs of recurrence by most recent MRI.  No recent headaches.  No dizziness.  Feels well in general.  Usually walks 3 miles every morning with his wife.  Health maintenance reviewed:  Health Maintenance  Topic Date Due   COVID-19 Vaccine (1) Never done   HIV Screening  Never done   DTaP/Tdap/Td (2 - Tdap) 02/03/2010   INFLUENZA VACCINE  05/04/2023 (Originally 09/04/2022)   Hepatitis C Screening  12/02/2023 (Originally 02/24/2000)   HPV VACCINES  Aged Out   -Declines flu vaccine -Agrees to Tdap -No specific risk factors for hepatitis C and declines testing  Social history-married.  He has 2 children ages 39 and 24.  Never smoked.  Only rare alcohol about 1 glass of wine per week.  He works for [employer].  Works remotely  Family history-mom is alive and well at age 48 with no chronic medical problems.  His father died age 13 of bladder cancer complications.  He has 1 sister who has Hashimoto's thyroiditis but no other health problems.  He had 1 grandparent with type 2 diabetes.  Past Medical History:  Diagnosis Date   Migraines    History reviewed. No pertinent surgical history.  reports that he has never smoked. He has never used smokeless tobacco. He reports current alcohol use. He reports that he does not use drugs. family history is not on file. No Known Allergies  Review of Systems  Constitutional:  Negative for chills, fever, malaise/fatigue and weight loss.  HENT:  Negative for hearing loss.   Eyes:  Negative for blurred vision and double vision.  Respiratory:  Negative for cough and  shortness of breath.   Cardiovascular:  Negative for chest pain, palpitations and leg swelling.  Gastrointestinal:  Negative for abdominal pain, blood in stool, constipation and diarrhea.  Genitourinary:  Negative for dysuria.  Skin:  Negative for rash.  Neurological:  Negative for dizziness, speech change, seizures, loss of consciousness and headaches.  Psychiatric/Behavioral:  Negative for depression.       Objective:     BP 124/70 (BP Location: Left Arm, Patient Position: Sitting, Cuff Size: Normal)   Pulse 75   Temp (!) 97.5 F (36.4 C) (Oral)   Ht 6' 0.44" (1.84 m)   Wt 185 lb 14.4 oz (84.3 kg)   SpO2 99%   BMI 24.91 kg/m  BP Readings from Last 3 Encounters:  12/02/22 124/70  05/01/20 138/64  09/30/17 110/70   Wt Readings from Last 3 Encounters:  12/02/22 185 lb 14.4 oz (84.3 kg)  05/01/20 188 lb 9.6 oz (85.5 kg)  09/30/17 181 lb 1.6 oz (82.1 kg)      Physical Exam Vitals reviewed.  Constitutional:      General: He is not in acute distress.    Appearance: He is well-developed. He is not ill-appearing.  HENT:     Head: Normocephalic and atraumatic.     Right Ear: External ear normal.     Left Ear: External ear normal.  Eyes:     Conjunctiva/sclera: Conjunctivae normal.  Pupils: Pupils are equal, round, and reactive to light.  Neck:     Thyroid: No thyromegaly.  Cardiovascular:     Rate and Rhythm: Normal rate and regular rhythm.     Heart sounds: Normal heart sounds. No murmur heard. Pulmonary:     Effort: No respiratory distress.     Breath sounds: No wheezing or rales.  Abdominal:     General: Bowel sounds are normal. There is no distension.     Palpations: Abdomen is soft. There is no mass.     Tenderness: There is no abdominal tenderness. There is no guarding or rebound.  Musculoskeletal:     Cervical back: Normal range of motion and neck supple.     Right lower leg: No edema.     Left lower leg: No edema.  Lymphadenopathy:     Cervical: No  cervical adenopathy.  Skin:    Findings: No rash.  Neurological:     Mental Status: He is alert and oriented to person, place, and time.     Cranial Nerves: No cranial nerve deficit.      No results found for any visits on 12/02/22.    The ASCVD Risk score (Arnett DK, et al., 2019) failed to calculate for the following reasons:   Cannot find a previous HDL lab   Cannot find a previous total cholesterol lab    Assessment & Plan:   Problem List Items Addressed This Visit   None Visit Diagnoses     Physical exam    -  Primary   Relevant Orders   Basic metabolic panel   Lipid panel   CBC with Differential/Platelet   Hepatic function panel     Healthy 42 year old male.  History of glioblastoma of the brain with prior surgery at [facility] and followed regularly there.  No evidence for recurrence on most recent imaging.  He denies any headaches or other concerning symptoms.  -Offered flu vaccine but he declines -Tdap given -Discussed screening labs as above -Discussed goal of minimum of 150 minutes of moderate intensity exercise per week  No follow-ups on file.    Evelena Peat, MD

## 2024-02-19 ENCOUNTER — Ambulatory Visit (INDEPENDENT_AMBULATORY_CARE_PROVIDER_SITE_OTHER): Admitting: Family Medicine

## 2024-02-19 ENCOUNTER — Encounter: Payer: Self-pay | Admitting: Family Medicine

## 2024-02-19 VITALS — BP 120/64 | HR 76 | Temp 98.1°F | Ht 72.84 in | Wt 194.0 lb

## 2024-02-19 DIAGNOSIS — Z0000 Encounter for general adult medical examination without abnormal findings: Secondary | ICD-10-CM | POA: Diagnosis not present

## 2024-02-19 NOTE — Progress Notes (Signed)
 "  Established Patient Office Visit  Subjective   Patient ID: Matthew Henry, male    DOB: Aug 23, 1980  Age: 44 y.o. MRN: [id number]  Chief Complaint  Patient presents with   Annual Exam    HPI    Matthew Henry is seen today for a well visit/physical exam.  He has history of glioblastoma which is followed closely at [facility].  This was resected back in 2017.  He gets MRI scans every 6 months and no evidence for recurrence on recent MRI.  Very health-conscious.  He walks daily and does some rowing for exercise.  Generally feels well.  No recent headaches.  Does have some chronic intermittent nasal congestion.  Takes no regular medications.  Frequently wakes up once at night to urinate and sometimes has difficulty going back to sleep.  Health maintenance reviewed:  Health Maintenance  Topic Date Due   COVID-19 Vaccine (1) Never done   HIV Screening  Never done   Hepatitis B Vaccines 19-59 Average Risk (1 of 3 - 19+ 3-dose series) Never done   Influenza Vaccine  05/03/2024 (Originally 09/04/2023)   Hepatitis C Screening  02/18/2025 (Originally 02/24/2000)   DTaP/Tdap/Td (3 - Td or Tdap) 12/01/2032   HPV VACCINES (No Doses Required) Completed   Pneumococcal Vaccine  Aged Out   Meningococcal B Vaccine  Aged Out   - Declines influenza vaccine  Family history-mom is alive and well at age 35 with no chronic medical problems.  His father died age 67 of bladder cancer complications.  He has 1 sister who has Hashimoto's thyroiditis but no other health problems.  He had 1 grandparent with type 2 diabetes.   Social history-married.  Has 2 children ages 10 and 6.  No history of smoking.  Rare alcohol.  Currently works for [employer] and works remotely.  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  Past Medical History:  Diagnosis Date   Migraines    History reviewed. No pertinent surgical history.  reports that he has never smoked. He has never used smokeless tobacco.  He reports current alcohol use. He reports that he does not use drugs. family history is not on file. Allergies[1]   Review of Systems  Constitutional:  Negative for chills, fever, malaise/fatigue and weight loss.  HENT:  Negative for hearing loss.   Eyes:  Negative for blurred vision and double vision.  Respiratory:  Negative for cough and shortness of breath.   Cardiovascular:  Negative for chest pain, palpitations and leg swelling.  Gastrointestinal:  Negative for abdominal pain, blood in stool, constipation and diarrhea.  Genitourinary:  Negative for dysuria.  Skin:  Negative for rash.  Neurological:  Negative for dizziness, speech change, seizures, loss of consciousness and headaches.  Psychiatric/Behavioral:  Negative for depression.       Objective:     BP 120/64   Pulse 76   Temp 98.1 F (36.7 C) (Oral)   Ht 6' 0.84 (1.85 m)   Wt 194 lb (88 kg)   SpO2 96%   BMI 25.71 kg/m  BP Readings from Last 3 Encounters:  02/19/24 120/64  12/02/22 124/70  05/01/20 138/64   Wt Readings from Last 3 Encounters:  02/19/24 194 lb (88 kg)  12/02/22 185 lb 14.4 oz (84.3 kg)  05/01/20 188 lb 9.6 oz (85.5 kg)      Physical Exam Vitals reviewed.  Constitutional:      General: He is not in acute distress.    Appearance: He  is well-developed. He is not ill-appearing.  HENT:     Head: Normocephalic and atraumatic.     Right Ear: External ear normal.     Left Ear: External ear normal.  Neck:     Thyroid: No thyromegaly.  Cardiovascular:     Rate and Rhythm: Normal rate and regular rhythm.     Heart sounds: Normal heart sounds. No murmur heard. Pulmonary:     Effort: No respiratory distress.     Breath sounds: No wheezing or rales.  Abdominal:     General: Bowel sounds are normal. There is no distension.     Palpations: Abdomen is soft. There is no mass.     Tenderness: There is no abdominal tenderness. There is no guarding or rebound.  Musculoskeletal:     Cervical  back: Normal range of motion and neck supple.  Lymphadenopathy:     Cervical: No cervical adenopathy.  Skin:    Findings: No rash.  Neurological:     Mental Status: He is alert and oriented to person, place, and time.     Cranial Nerves: No cranial nerve deficit.      No results found for any visits on 02/19/24.  Last CBC Lab Results  Component Value Date   WBC 5.3 12/02/2022   HGB 14.5 12/02/2022   HCT 44.9 12/02/2022   MCV 93.0 12/02/2022   MCH 32.6 01/02/2016   RDW 13.3 12/02/2022   PLT 232.0 12/02/2022   Last metabolic panel Lab Results  Component Value Date   GLUCOSE 98 12/02/2022   NA 138 12/02/2022   K 4.9 12/02/2022   CL 103 12/02/2022   CO2 30 12/02/2022   BUN 17 12/02/2022   CREATININE 0.97 12/02/2022   GFR 97.47 12/02/2022   CALCIUM 9.8 12/02/2022   PROT 7.3 12/02/2022   ALBUMIN 4.6 12/02/2022   BILITOT 1.3 (H) 12/02/2022   ALKPHOS 37 (L) 12/02/2022   AST 16 12/02/2022   ALT 15 12/02/2022   ANIONGAP 5 06/23/2015   Last lipids Lab Results  Component Value Date   CHOL 203 (H) 12/02/2022   HDL 59.10 12/02/2022   LDLCALC 135 (H) 12/02/2022   TRIG 42.0 12/02/2022   CHOLHDL 3 12/02/2022   Last thyroid functions Lab Results  Component Value Date   TSH 1.38 10/25/2012      The 10-year ASCVD risk score (Arnett DK, et al., 2019) is: 0.9%    Assessment & Plan:   Problem List Items Addressed This Visit   None Visit Diagnoses       Physical exam    -  Primary     Healthy 44 year old male.  History of glioblastoma resected 2017 at [facility].  Has done very well since that time.  He gets MRI scans every 6 months.  No evidence for recurrence on recent MRI.  He is exercising fairly regularly.  He had labs last year which were favorable except for LDL cholesterol 135.  We did offer labs today but he has decided to defer.  We discussed flu vaccine and he declines.  Recommend initial screening colonoscopy by age 27.  No follow-ups on file.     Wolm Scarlet, MD     [1] No Known Allergies  "
